# Patient Record
Sex: Female | Born: 1995 | Race: White | Hispanic: No | Marital: Single | Smoking: Never smoker
Health system: Southern US, Community
[De-identification: ages and names within clinical notes are randomized; demographics above are authoritative.]

## PROBLEM LIST (undated history)

## (undated) DIAGNOSIS — M7989 Other specified soft tissue disorders: Secondary | ICD-10-CM

## (undated) HISTORY — DX: Other specified soft tissue disorders: M79.89

---
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
## 2018-01-21 ENCOUNTER — Emergency Department: Payer: Medicaid Other

## 2018-01-21 ENCOUNTER — Encounter: Payer: Self-pay | Admitting: Internal Medicine

## 2018-01-21 ENCOUNTER — Inpatient Hospital Stay
Admission: EM | Admit: 2018-01-21 | Discharge: 2018-01-25 | DRG: 584 | Disposition: A | Discharge status: Home / Self Care | Payer: Medicaid Other | Source: Ambulatory Visit | Attending: Internal Medicine | Admitting: Internal Medicine

## 2018-01-21 ENCOUNTER — Other Ambulatory Visit: Payer: Self-pay

## 2018-01-21 DIAGNOSIS — M7989 Other specified soft tissue disorders: Secondary | ICD-10-CM

## 2018-01-21 DIAGNOSIS — N611 Abscess of the breast and nipple: Secondary | ICD-10-CM | POA: Diagnosis present

## 2018-01-21 DIAGNOSIS — I96 Gangrene, not elsewhere classified: Secondary | ICD-10-CM | POA: Diagnosis present

## 2018-01-21 DIAGNOSIS — E876 Hypokalemia: Secondary | ICD-10-CM | POA: Diagnosis present

## 2018-01-21 DIAGNOSIS — R8271 Bacteriuria: Secondary | ICD-10-CM | POA: Diagnosis present

## 2018-01-21 DIAGNOSIS — R740 Nonspecific elevation of levels of transaminase and lactic acid dehydrogenase [LDH]: Secondary | ICD-10-CM | POA: Diagnosis present

## 2018-01-21 DIAGNOSIS — M726 Necrotizing fasciitis: Secondary | ICD-10-CM

## 2018-01-21 LAB — COMPREHENSIVE METABOLIC PANEL
ALK PHOS: 56 U/L (ref 38–126)
ALT: 18 U/L (ref 0–44)
ANION GAP: 10 (ref 5–15)
AST: 23 U/L (ref 15–41)
Albumin: 3.9 g/dL (ref 3.5–5.0)
BILIRUBIN TOTAL: 0.5 mg/dL (ref 0.3–1.2)
BUN: 10 mg/dL (ref 6–20)
CALCIUM: 9.1 mg/dL (ref 8.9–10.3)
CO2: 27 mmol/L (ref 22–32)
Chloride: 105 mmol/L (ref 98–111)
Creatinine, Ser: 0.8 mg/dL (ref 0.44–1.00)
Glucose, Bld: 121 mg/dL — ABNORMAL HIGH (ref 70–99)
POTASSIUM: 3.1 mmol/L — AB (ref 3.5–5.1)
Sodium: 142 mmol/L (ref 135–145)
Total Protein: 7.9 g/dL (ref 6.5–8.1)

## 2018-01-21 LAB — CBC WITH DIFFERENTIAL/PLATELET
BASOS PCT: 1 %
Basophils Absolute: 0.1 10*3/uL (ref 0–0.1)
Eosinophils Absolute: 0 10*3/uL (ref 0–0.7)
Eosinophils Relative: 0 %
HEMATOCRIT: 38.4 % (ref 35.0–47.0)
Hemoglobin: 13 g/dL (ref 12.0–16.0)
LYMPHS PCT: 11 %
Lymphs Abs: 1.5 10*3/uL (ref 1.0–3.6)
MCH: 27.4 pg (ref 26.0–34.0)
MCHC: 33.8 g/dL (ref 32.0–36.0)
MCV: 81.2 fL (ref 80.0–100.0)
MONO ABS: 1.5 10*3/uL — AB (ref 0.2–0.9)
Monocytes Relative: 11 %
NEUTROS ABS: 10.8 10*3/uL — AB (ref 1.4–6.5)
Neutrophils Relative %: 77 %
PLATELETS: 407 10*3/uL (ref 150–440)
RBC: 4.73 MIL/uL (ref 3.80–5.20)
RDW: 14.2 % (ref 11.5–14.5)
WBC: 13.9 10*3/uL — AB (ref 3.6–11.0)

## 2018-01-21 LAB — URINALYSIS, COMPLETE (UACMP) WITH MICROSCOPIC
BILIRUBIN URINE: NEGATIVE
Glucose, UA: NEGATIVE mg/dL
KETONES UR: NEGATIVE mg/dL
NITRITE: POSITIVE — AB
PH: 5 (ref 5.0–8.0)
Protein, ur: 30 mg/dL — AB
Specific Gravity, Urine: 1.025 (ref 1.005–1.030)

## 2018-01-21 LAB — LACTIC ACID, PLASMA
LACTIC ACID, VENOUS: 1.1 mmol/L (ref 0.5–1.9)
Lactic Acid, Venous: 2.6 mmol/L (ref 0.5–1.9)

## 2018-01-21 LAB — PREGNANCY, URINE: Preg Test, Ur: NEGATIVE

## 2018-01-21 LAB — HEMOGLOBIN A1C
Hgb A1c MFr Bld: 5.5 % (ref 4.8–5.6)
Mean Plasma Glucose: 111.15 mg/dL

## 2018-01-21 MED ORDER — SODIUM CHLORIDE 0.9 % IV SOLN
1.0000 g | Freq: Once | INTRAVENOUS | Status: AC
  Administered 2018-01-21: 1 g via INTRAVENOUS
  Filled 2018-01-21: qty 1

## 2018-01-21 MED ORDER — ONDANSETRON HCL 4 MG PO TABS: 4.0000 mg | ORAL_TABLET | Freq: Four times a day (QID) | ORAL | Status: DC | PRN

## 2018-01-21 MED ORDER — ENOXAPARIN SODIUM 40 MG/0.4ML ~~LOC~~ SOLN: 40.0000 mg | SUBCUTANEOUS | Status: DC

## 2018-01-21 MED ORDER — ACETAMINOPHEN 325 MG PO TABS: 650.0000 mg | ORAL_TABLET | Freq: Four times a day (QID) | ORAL | Status: DC | PRN

## 2018-01-21 MED ORDER — ALBUTEROL SULFATE (2.5 MG/3ML) 0.083% IN NEBU: 2.5000 mg | INHALATION_SOLUTION | RESPIRATORY_TRACT | Status: DC | PRN

## 2018-01-21 MED ORDER — SODIUM CHLORIDE 0.9 % IV BOLUS
1000.0000 mL | Freq: Once | INTRAVENOUS | Status: AC
  Administered 2018-01-21: 1000 mL via INTRAVENOUS

## 2018-01-21 MED ORDER — VANCOMYCIN HCL IN DEXTROSE 1-5 GM/200ML-% IV SOLN
1000.0000 mg | Freq: Once | INTRAVENOUS | Status: AC
  Administered 2018-01-21: 1000 mg via INTRAVENOUS
  Filled 2018-01-21: qty 200

## 2018-01-21 MED ORDER — POTASSIUM CHLORIDE CRYS ER 20 MEQ PO TBCR
40.0000 meq | EXTENDED_RELEASE_TABLET | Freq: Once | ORAL | Status: AC
  Administered 2018-01-21: 40 meq via ORAL
  Filled 2018-01-21: qty 2

## 2018-01-21 MED ORDER — LIDOCAINE HCL (PF) 1 % IJ SOLN
INTRAMUSCULAR | Status: AC
  Filled 2018-01-21: qty 5

## 2018-01-21 MED ORDER — POLYETHYLENE GLYCOL 3350 17 G PO PACK: 17.0000 g | PACK | Freq: Every day | ORAL | Status: DC | PRN

## 2018-01-21 MED ORDER — ACETAMINOPHEN 650 MG RE SUPP: 650.0000 mg | Freq: Four times a day (QID) | RECTAL | Status: DC | PRN

## 2018-01-21 MED ORDER — ONDANSETRON HCL 4 MG/2ML IJ SOLN: 4.0000 mg | Freq: Four times a day (QID) | INTRAMUSCULAR | Status: DC | PRN

## 2018-01-21 MED ORDER — IBUPROFEN 400 MG PO TABS: 400.0000 mg | ORAL_TABLET | Freq: Four times a day (QID) | ORAL | Status: DC | PRN

## 2018-01-21 NOTE — ED Notes (Signed)
Ellen RN, aware of bed assigned  

## 2018-01-21 NOTE — ED Provider Notes (Signed)
[facility] Emergency Department Provider Note   ____________________________________________   None    (approximate)  I have reviewed the triage vital signs and the nursing notes.   HISTORY  Chief Complaint Breast Pain  Necrotizing nipple infection  HPI Garyn Waguespack is a 22 y.o. female she reports breast infection with draining pus and abscess started 3 days ago pain was severe she had a fever today at burst she went [facility] see the [facility] and was sent here.  Patient has about a silver dollar sized ulcer with necrosis at the base that smells foul covering most of the nipple and the upper part of the breast there is at least 4 cm surrounding erythema and firmness in the rest of the breast the nipple ring the patient has is partially buried in the infection patient reports it does not hurt anymore   No past medical history on file.  There are no active problems to display for this patient.   No past surgical history on file.  Prior to Admission medications   Not on File    Allergies Patient has no allergy information on record.  No family history on file.  Social History Social History   Tobacco Use  . Smoking status: Never Smoker  Substance Use Topics  . Alcohol use: No  . Drug use: No    Review of Systems  Constitutional:  fever/chills Eyes: No visual changes. ENT: No sore throat. Cardiovascular: Denies chest pain. Respiratory: Denies shortness of breath. Gastrointestinal: No abdominal pain.  No nausea, no vomiting.  No diarrhea.  No constipation. Genitourinary: Negative for dysuria. Musculoskeletal: Negative for back pain. Skin: Negative for rash. Neurological: Negative for headaches, focal weakness ____________________________________________   PHYSICAL EXAM:  VITAL SIGNS: ED Triage Vitals [01/21/18 1956]  Enc Vitals Group     BP      Pulse      Resp      Temp      Temp src      SpO2      Weight      Height    Head Circumference      Peak Flow      Pain Score 1     Pain Loc      Pain Edu?      Excl. in GC?     Constitutional: Alert and oriented Eyes: Conjunctivae are normal.  Head: Atraumatic. Nose: No congestion/rhinnorhea. Mouth/Throat: Mucous membranes are moist.  Oropharynx non-erythematous. Neck: No stridor.   Cardiovascular: Normal rate, regular rhythm. Grossly normal heart sounds.  Good peripheral circulation. Respiratory: Normal respiratory effort.  No retractions. Lungs CTAB. Gastrointestinal: Soft and nontender. No distention. No abdominal bruits. No CVA tenderness. Musculoskeletal: No lower extremity tenderness nor edema.  Neurologic:  Normal speech and language. No gross focal neurologic deficits are appreciated.  Skin:  Skin is warm, dry and intact. No rash noted. Psychiatric: Mood and affect are normal. Speech and behavior are normal.  ____________________________________________   LABS (all labs ordered are listed, but only abnormal results are displayed)  Labs Reviewed  COMPREHENSIVE METABOLIC PANEL  CBC WITH DIFFERENTIAL/PLATELET  LACTIC ACID, PLASMA  LACTIC ACID, PLASMA  URINALYSIS, COMPLETE (UACMP) WITH MICROSCOPIC  PREGNANCY, URINE   ____________________________________________  EKG EKG read and interpreted by me shows normal sinus rhythm at rate of 80 normal axis no acute ST-T wave changes ________________________________________  RADIOLOGY  ED MD interpretation: Chest x-ray shows no acute disease unfortunately the breasts were cut off of the  lateral.  X-ray is unable to do any sort of mammogram without compression therefore we will attempt to get another lateral to see if there is any air in the soft tissue of the right breast.  Official radiology report(s): No results found.  ____________________________________________   PROCEDURES  Procedure(s) performed: Oral consent was obtained.  The wound was cleaned with very dilute Betadine at least 1-10  dilution.  The area where the nipple ring is was anesthetized with lidocaine and I attempted to grasp the nipple ring and unscrew it.  Unfortunate was unable to do this however in the course of doing so I noted that the hole in the skin was actually fairly wide and I was able to gently ease the ring out of the skin without unscrewing it.  Patient tolerated this fairly well.  Procedures  Critical Care performed: Nickel care time half an hour this includes the above procedure and discussing the patient with the hospitalist reviewing the records from the urgent care and ordering blood work and antibiotics  ____________________________________________   INITIAL IMPRESSION / ASSESSMENT AND PLAN / ED COURSE  Patient with an apparent necrotic ulcer with surrounding cellulitis of the breast.  Treat her aggressively with antibiotics.  When I probed the wound after anesthetizing it did not feel any thing that would easily give way or tear.  I believe that with very close monitoring antibiotics we should be good.  I discussed the patient with hospitalist and the surgeon Dr. Elenor Legato.      ____________________________________________   FINAL CLINICAL IMPRESSION(S) / ED DIAGNOSES  Final diagnoses:  Necrotizing soft tissue infection     ED Discharge Orders    None       Note:  This document was prepared using Dragon voice recognition software and may include unintentional dictation errors.    Arnaldo Natal, MD 01/21/18 2109

## 2018-01-21 NOTE — ED Notes (Signed)
Dr  Elpidio Anis notified  Of lactic acid

## 2018-01-21 NOTE — ED Triage Notes (Signed)
Pt  Has  Breast infection r  Breast area   With  Red draining pus  Area   X 3 days  Had fever a  Few days ago  Getting  Worse and it burst today    Eschar present  Pt has a nipple ring in place   Pt was seen at [facility] and transferred here  Pt was given clindamycin  im  At [facility] and ems started ns iv  Has had about 200 cc

## 2018-01-21 NOTE — H&P (Signed)
[facility] [place] [facility]   PATIENT NAME: Tracy Stewart    MR#:  [id number]  DATE OF BIRTH:  [birth year]  DATE OF ADMISSION:  01/21/2018  PRIMARY CARE PHYSICIAN: Patient, No Pcp Per   REQUESTING/REFERRING PHYSICIAN: Dr. Darnelle Catalan  CHIEF COMPLAINT:   Chief Complaint  Patient presents with  . Breast Pain    HISTORY OF PRESENT ILLNESS:  Tracy Stewart  is a 22 y.o. female with no significant past medical history presented to the emergency room sent in from urgent care after being found to have breast abscess.  Patient has had redness and swelling along with fever for 3 days.  Today it spontaneously started draining.  She did have nipple rings which were removed in the emergency room.  She has had these for 5 years.  Is not breast-feeding.  Does not use any IV drugs.  No recent breast procedures. Nondiabetic.  PAST MEDICAL HISTORY:  History reviewed. No pertinent past medical history. No diabetes or hypertension PAST SURGICAL HISTORY:  No past surgical history on file.  SOCIAL HISTORY:   Social History   Tobacco Use  . Smoking status: Never Smoker  Substance Use Topics  . Alcohol use: No    FAMILY HISTORY:   Family History  Problem Relation Age of Onset  . Breast cancer Neg Hx     DRUG ALLERGIES:  No Known Allergies  REVIEW OF SYSTEMS:   Review of Systems  Constitutional: Positive for chills, fever and malaise/fatigue. Negative for weight loss.  HENT: Negative for hearing loss and nosebleeds.   Eyes: Negative for blurred vision, double vision and pain.  Respiratory: Negative for cough, hemoptysis, sputum production, shortness of breath and wheezing.   Cardiovascular: Negative for chest pain, palpitations, orthopnea and leg swelling.  Gastrointestinal: Negative for abdominal pain, constipation, diarrhea, nausea and vomiting.  Genitourinary: Negative for dysuria and hematuria.  Musculoskeletal: Negative for back pain, falls and  myalgias.  Skin: Negative for rash.  Neurological: Negative for dizziness, tremors, sensory change, speech change, focal weakness, seizures and headaches.  Endo/Heme/Allergies: Does not bruise/bleed easily.  Psychiatric/Behavioral: Negative for depression and memory loss. The patient is not nervous/anxious.     MEDICATIONS AT HOME:   Prior to Admission medications   Not on File     VITAL SIGNS:  Blood pressure 138/86, pulse 97, temperature 98.3 F (36.8 C), temperature source Oral, height 5\' 7"  (1.702 m), weight 68.5 kg, last menstrual period 01/07/2018, SpO2 100 %.  PHYSICAL EXAMINATION:  Physical Exam  GENERAL:  22 y.o.-year-old patient lying in the bed with no acute distress.  EYES: Pupils equal, round, reactive to light and accommodation. No scleral icterus. Extraocular muscles intact.  HEENT: Head atraumatic, normocephalic. Oropharynx and nasopharynx clear. No oropharyngeal erythema, moist oral mucosa  NECK:  Supple, no jugular venous distention. No thyroid enlargement, no tenderness.  LUNGS: Normal breath sounds bilaterally, no wheezing, rales, rhonchi. No use of accessory muscles of respiration.  CARDIOVASCULAR: S1, S2 normal. No murmurs, rubs, or gallops.  ABDOMEN: Soft, nontender, nondistended. Bowel sounds present. No organomegaly or mass.  EXTREMITIES: No pedal edema, cyanosis, or clubbing. + 2 pedal & radial pulses b/l.   NEUROLOGIC: Cranial nerves II through XII are intact. No focal Motor or sensory deficits appreciated b/l PSYCHIATRIC: The patient is alert and oriented x 3. Good affect.  SKIN: Right breast with redness covering half her breast.  Nipple and areole with fungating ulcer.  Purulent discharge.  LABORATORY PANEL:   CBC No results  for input(s): WBC, HGB, HCT, PLT in the last 168 hours. ------------------------------------------------------------------------------------------------------------------  Chemistries  No results for input(s): NA, K, CL, CO2,  GLUCOSE, BUN, CREATININE, CALCIUM, MG, AST, ALT, ALKPHOS, BILITOT in the last 168 hours.  Invalid input(s): GFRCGP ------------------------------------------------------------------------------------------------------------------  Cardiac Enzymes No results for input(s): TROPONINI in the last 168 hours. ------------------------------------------------------------------------------------------------------------------  RADIOLOGY:  No results found.   IMPRESSION AND PLAN:   * Right breat abscess. Spontaneously draining Will start IV abx Consult surgery Pain meds PRN Nipple ring removed  * Pyuria without symptoms Not uti Patient is on broad-spectrum antibiotics for breast abscess   *Elevated lactic acid due to breast abscess.  We will bolus 1 L normal saline stat.  *Hypokalemia.  Will replace orally.  All the records are reviewed and case discussed with ED provider. Management plans discussed with the patient, family and they are in agreement.  CODE STATUS: Full code  TOTAL TIME TAKING CARE OF THIS PATIENT: 40 minutes.   Molinda Bailiff Terrelle Ruffolo M.D on 01/21/2018 at 8:58 PM  Between 7am to 6pm - Pager - [phone]  After 6pm go to [email or web address] - password [email or web address]  [facility] [place] [facility]  Office  [phone]  CC: Primary care physician; Patient, No Pcp Per  Note: This dictation was prepared with Dragon dictation along with smaller phrase technology. Any transcriptional errors that result from this process are unintentional.

## 2018-01-21 NOTE — Consult Note (Signed)
Patient ID: Tracy Stewart, female   DOB: 03/07/1996, 22 y.o.   MRN: [id number]  HPI Tracy Stewart is a 22 y.o. female seen in consultation at the request of Dr. Malinda.  Case discussed with him in detail. Came in with 3 days of right breast pain, drainage of purulence .  He reports that the pain has been worsening for the last couple of days and today she had a spontaneous drainage of pus.  No fevers no chills.  Patient denies any family history of breast cancer.  She does have multiple piercings including a piercing on the right breast.  She is able to perform more than 4 METS of activity without any shortness of breath or chest pain.  The chest x-ray was personally reviewed showing no acute pulmonary disease.  She was 13.9 and a CMP was normal.  HPI  History reviewed. No pertinent past medical history.  No pertinent surgical or family hx.  Family History  Problem Relation Age of Onset  . Breast cancer Neg Hx     Social History Social History   Tobacco Use  . Smoking status: Never Smoker  Substance Use Topics  . Alcohol use: No  . Drug use: No    No Known Allergies  Current Facility-Administered Medications  Medication Dose Route Frequency Provider Last Rate Last Dose  . acetaminophen (TYLENOL) tablet 650 mg  650 mg Oral Q6H PRN Sudini, Srikar, MD       Or  . acetaminophen (TYLENOL) suppository 650 mg  650 mg Rectal Q6H PRN Sudini, Srikar, MD      . albuterol (PROVENTIL) (2.5 MG/3ML) 0.083% nebulizer solution 2.5 mg  2.5 mg Nebulization Q2H PRN Sudini, Srikar, MD      . enoxaparin (LOVENOX) injection 40 mg  40 mg Subcutaneous Q24H Sudini, Srikar, MD      . ibuprofen (ADVIL,MOTRIN) tablet 400 mg  400 mg Oral Q6H PRN Sudini, Srikar, MD      . lidocaine (PF) (XYLOCAINE) 1 % injection           . ondansetron (ZOFRAN) tablet 4 mg  4 mg Oral Q6H PRN Sudini, Srikar, MD       Or  . ondansetron (ZOFRAN) injection 4 mg  4 mg Intravenous Q6H PRN Sudini, Srikar, MD      .  polyethylene glycol (MIRALAX / GLYCOLAX) packet 17 g  17 g Oral Daily PRN Sudini, Srikar, MD       No current outpatient medications on file.     Review of Systems Full ROS  was asked and was negative except for the information on the HPI  Physical Exam Blood pressure 114/76, pulse (!) 104, temperature 98.3 F (36.8 C), temperature source Oral, resp. rate (!) 28, height 5' 7" (1.702 m), weight 68.5 kg, last menstrual period 01/07/2018, SpO2 100 %. CONSTITUTIONAL: NAD EYES: Pupils are equal, round, and reactive to light, Sclera are non-icteric. EARS, NOSE, MOUTH AND THROAT: The oropharynx is clear. The oral mucosa is pink and moist. Hearing is intact to voice. LYMPH NODES:  Lymph nodes in the neck are normal. RESPIRATORY:  Lungs are clear. There is normal respiratory effort, with equal breath sounds bilaterally, and without pathologic use of accessory muscles. CARDIOVASCULAR: Heart is regular without murmurs, gallops, or rubs. BREAST: Right breast cellulitis, twisted tenderness and necrotic nipple.  There is some spontaneous drainage of pus.  There is significant induration. GI: The abdomen is  soft, nontender, and nondistended. There are no palpable masses. There is no   hepatosplenomegaly. There are normal bowel sounds in all quadrants. GU: Rectal deferred.   MUSCULOSKELETAL: Normal muscle strength and tone. No cyanosis or edema.   SKIN: Turgor is good and there are no pathologic skin lesions or ulcers. NEUROLOGIC: Motor and sensation is grossly normal. Cranial nerves are grossly intact. PSYCH:  Oriented to person, place and time. Affect is normal.  Data Reviewed  I have personally reviewed the patient's imaging, laboratory findings and medical records.    Assessment/Plan 22 yo female with right breast cellulitis abscess and necrotic wound in need for wide debridement.  Discussed with the patient detail about her disease process.  We will start broad-spectrum antibiotics, IV fluids and  we will perform the surgery in the morning.  Discussed with the patient detail about the procedure.  Risk benefit and possible complications including but not limited to: Bleeding, infection, deformity.  I discussed specifically about cosmetic issues given the fact that most of her right nipple is already necrosis and without a doubt she will have significant scarring and deformity to the right breast. He understands and wishes to proceed.  Dr. Davis will be performing the surgery tomorrow  Tracy Palmateer, MD FACS General Surgeon 01/21/2018, 10:42 PM   

## 2018-01-21 NOTE — H&P (View-Only) (Signed)
Patient ID: Tracy Stewart, female   DOB: 07/18/1995, 22 y.o.   MRN: [id number]  HPI Tracy Stewart is a 22 y.o. female seen in consultation at the request of Dr. Darnelle Catalan.  Case discussed with him in detail. Came in with 3 days of right breast pain, drainage of purulence .  He reports that the pain has been worsening for the last couple of days and today she had a spontaneous drainage of pus.  No fevers no chills.  Patient denies any family history of breast cancer.  She does have multiple piercings including a piercing on the right breast.  She is able to perform more than 4 METS of activity without any shortness of breath or chest pain.  The chest x-ray was personally reviewed showing no acute pulmonary disease.  She was 13.9 and a CMP was normal.  HPI  History reviewed. No pertinent past medical history.  No pertinent surgical or family hx.  Family History  Problem Relation Age of Onset  . Breast cancer Neg Hx     Social History Social History   Tobacco Use  . Smoking status: Never Smoker  Substance Use Topics  . Alcohol use: No  . Drug use: No    No Known Allergies  Current Facility-Administered Medications  Medication Dose Route Frequency Provider Last Rate Last Dose  . acetaminophen (TYLENOL) tablet 650 mg  650 mg Oral Q6H PRN Milagros Loll, MD       Or  . acetaminophen (TYLENOL) suppository 650 mg  650 mg Rectal Q6H PRN Sudini, Srikar, MD      . albuterol (PROVENTIL) (2.5 MG/3ML) 0.083% nebulizer solution 2.5 mg  2.5 mg Nebulization Q2H PRN Sudini, Srikar, MD      . enoxaparin (LOVENOX) injection 40 mg  40 mg Subcutaneous Q24H Sudini, Srikar, MD      . ibuprofen (ADVIL,MOTRIN) tablet 400 mg  400 mg Oral Q6H PRN Sudini, Srikar, MD      . lidocaine (PF) (XYLOCAINE) 1 % injection           . ondansetron (ZOFRAN) tablet 4 mg  4 mg Oral Q6H PRN Sudini, Wardell Heath, MD       Or  . ondansetron (ZOFRAN) injection 4 mg  4 mg Intravenous Q6H PRN Sudini, Srikar, MD      .  polyethylene glycol (MIRALAX / GLYCOLAX) packet 17 g  17 g Oral Daily PRN Milagros Loll, MD       No current outpatient medications on file.     Review of Systems Full ROS  was asked and was negative except for the information on the HPI  Physical Exam Blood pressure 114/76, pulse (!) 104, temperature 98.3 F (36.8 C), temperature source Oral, resp. rate (!) 28, height 5\' 7"  (1.702 m), weight 68.5 kg, last menstrual period 01/07/2018, SpO2 100 %. CONSTITUTIONAL: NAD EYES: Pupils are equal, round, and reactive to light, Sclera are non-icteric. EARS, NOSE, MOUTH AND THROAT: The oropharynx is clear. The oral mucosa is pink and moist. Hearing is intact to voice. LYMPH NODES:  Lymph nodes in the neck are normal. RESPIRATORY:  Lungs are clear. There is normal respiratory effort, with equal breath sounds bilaterally, and without pathologic use of accessory muscles. CARDIOVASCULAR: Heart is regular without murmurs, gallops, or rubs. BREAST: Right breast cellulitis, twisted tenderness and necrotic nipple.  There is some spontaneous drainage of pus.  There is significant induration. GI: The abdomen is  soft, nontender, and nondistended. There are no palpable masses. There is no  hepatosplenomegaly. There are normal bowel sounds in all quadrants. GU: Rectal deferred.   MUSCULOSKELETAL: Normal muscle strength and tone. No cyanosis or edema.   SKIN: Turgor is good and there are no pathologic skin lesions or ulcers. NEUROLOGIC: Motor and sensation is grossly normal. Cranial nerves are grossly intact. PSYCH:  Oriented to person, place and time. Affect is normal.  Data Reviewed  I have personally reviewed the patient's imaging, laboratory findings and medical records.    Assessment/Plan 22 yo female with right breast cellulitis abscess and necrotic wound in need for wide debridement.  Discussed with the patient detail about her disease process.  We will start broad-spectrum antibiotics, IV fluids and  we will perform the surgery in the morning.  Discussed with the patient detail about the procedure.  Risk benefit and possible complications including but not limited to: Bleeding, infection, deformity.  I discussed specifically about cosmetic issues given the fact that most of her right nipple is already necrosis and without a doubt she will have significant scarring and deformity to the right breast. He understands and wishes to proceed.  Dr. Earlene Plater will be performing the surgery tomorrow  Sterling Big, MD FACS General Surgeon 01/21/2018, 10:42 PM

## 2018-01-21 NOTE — ED Notes (Signed)
Wound care and dressing by vanessa

## 2018-01-22 ENCOUNTER — Inpatient Hospital Stay: Payer: Medicaid Other | Admitting: Certified Registered"

## 2018-01-22 ENCOUNTER — Encounter
Admission: EM | Disposition: A | Discharge status: Home / Self Care | Payer: Self-pay | Source: Ambulatory Visit | Attending: Internal Medicine

## 2018-01-22 DIAGNOSIS — M7989 Other specified soft tissue disorders: Secondary | ICD-10-CM

## 2018-01-22 DIAGNOSIS — M726 Necrotizing fasciitis: Secondary | ICD-10-CM

## 2018-01-22 HISTORY — PX: INCISION AND DRAINAGE ABSCESS: SHX5864

## 2018-01-22 LAB — CBC
HEMATOCRIT: 36.4 % (ref 35.0–47.0)
HEMOGLOBIN: 12.4 g/dL (ref 12.0–16.0)
MCH: 27.7 pg (ref 26.0–34.0)
MCHC: 34.2 g/dL (ref 32.0–36.0)
MCV: 80.9 fL (ref 80.0–100.0)
Platelets: 361 10*3/uL (ref 150–440)
RBC: 4.49 MIL/uL (ref 3.80–5.20)
RDW: 13.9 % (ref 11.5–14.5)
WBC: 9.3 10*3/uL (ref 3.6–11.0)

## 2018-01-22 LAB — SURGICAL PCR SCREEN
MRSA, PCR: NEGATIVE
STAPHYLOCOCCUS AUREUS: NEGATIVE

## 2018-01-22 LAB — BASIC METABOLIC PANEL
ANION GAP: 7 (ref 5–15)
BUN: 9 mg/dL (ref 6–20)
CALCIUM: 8.5 mg/dL — AB (ref 8.9–10.3)
CO2: 25 mmol/L (ref 22–32)
Chloride: 109 mmol/L (ref 98–111)
Creatinine, Ser: 0.72 mg/dL (ref 0.44–1.00)
GLUCOSE: 95 mg/dL (ref 70–99)
POTASSIUM: 3.8 mmol/L (ref 3.5–5.1)
SODIUM: 141 mmol/L (ref 135–145)

## 2018-01-22 SURGERY — INCISION AND DRAINAGE, ABSCESS
Anesthesia: General | Laterality: Right

## 2018-01-22 MED ORDER — TRAMADOL HCL 50 MG PO TABS: 50.0000 mg | ORAL_TABLET | Freq: Four times a day (QID) | ORAL | Status: DC | PRN

## 2018-01-22 MED ORDER — ENOXAPARIN SODIUM 40 MG/0.4ML ~~LOC~~ SOLN
40.0000 mg | SUBCUTANEOUS | Status: DC
  Administered 2018-01-23 – 2018-01-24 (×2): 40 mg via SUBCUTANEOUS
  Filled 2018-01-22 (×3): qty 0.4

## 2018-01-22 MED ORDER — VANCOMYCIN HCL IN DEXTROSE 1-5 GM/200ML-% IV SOLN
INTRAVENOUS | Status: AC
  Administered 2018-01-22: 10:00:00
  Filled 2018-01-22: qty 200

## 2018-01-22 MED ORDER — ONDANSETRON HCL 4 MG/2ML IJ SOLN
INTRAMUSCULAR | Status: DC | PRN
  Administered 2018-01-22: 4 mg via INTRAVENOUS

## 2018-01-22 MED ORDER — FENTANYL CITRATE (PF) 100 MCG/2ML IJ SOLN
INTRAMUSCULAR | Status: AC
  Filled 2018-01-22: qty 2

## 2018-01-22 MED ORDER — BUPIVACAINE HCL (PF) 0.5 % IJ SOLN
INTRAMUSCULAR | Status: AC
  Filled 2018-01-22: qty 30

## 2018-01-22 MED ORDER — KCL IN DEXTROSE-NACL 20-5-0.45 MEQ/L-%-% IV SOLN
INTRAVENOUS | Status: DC
  Administered 2018-01-22 – 2018-01-25 (×6): via INTRAVENOUS
  Filled 2018-01-22 (×9): qty 1000

## 2018-01-22 MED ORDER — SODIUM CHLORIDE FLUSH 0.9 % IV SOLN
INTRAVENOUS | Status: AC
  Administered 2018-01-22: 16:00:00
  Filled 2018-01-22: qty 10

## 2018-01-22 MED ORDER — PROPOFOL 10 MG/ML IV BOLUS
INTRAVENOUS | Status: DC | PRN
  Administered 2018-01-22: 150 mg via INTRAVENOUS
  Administered 2018-01-22: 50 mg via INTRAVENOUS

## 2018-01-22 MED ORDER — FENTANYL CITRATE (PF) 100 MCG/2ML IJ SOLN
INTRAMUSCULAR | Status: DC | PRN
  Administered 2018-01-22 (×4): 50 ug via INTRAVENOUS

## 2018-01-22 MED ORDER — VANCOMYCIN HCL IN DEXTROSE 1-5 GM/200ML-% IV SOLN
1000.0000 mg | Freq: Three times a day (TID) | INTRAVENOUS | Status: DC
  Filled 2018-01-22 (×3): qty 200

## 2018-01-22 MED ORDER — BUPIVACAINE HCL (PF) 0.5 % IJ SOLN
INTRAMUSCULAR | Status: DC | PRN
  Administered 2018-01-22: 20 mL

## 2018-01-22 MED ORDER — VANCOMYCIN HCL IN DEXTROSE 1-5 GM/200ML-% IV SOLN
1000.0000 mg | Freq: Three times a day (TID) | INTRAVENOUS | Status: DC
  Administered 2018-01-22 – 2018-01-23 (×4): 1000 mg via INTRAVENOUS
  Filled 2018-01-22 (×6): qty 200

## 2018-01-22 MED ORDER — KETOROLAC TROMETHAMINE 30 MG/ML IJ SOLN
30.0000 mg | Freq: Four times a day (QID) | INTRAMUSCULAR | Status: DC
  Administered 2018-01-22 – 2018-01-23 (×6): 30 mg via INTRAVENOUS
  Filled 2018-01-22 (×9): qty 1

## 2018-01-22 MED ORDER — ACETAMINOPHEN 500 MG PO TABS
1000.0000 mg | ORAL_TABLET | Freq: Four times a day (QID) | ORAL | Status: DC
  Administered 2018-01-22 – 2018-01-24 (×7): 1000 mg via ORAL
  Filled 2018-01-22 (×9): qty 2

## 2018-01-22 MED ORDER — PROPOFOL 10 MG/ML IV BOLUS
INTRAVENOUS | Status: AC
  Filled 2018-01-22: qty 20

## 2018-01-22 MED ORDER — LIDOCAINE HCL (CARDIAC) PF 100 MG/5ML IV SOSY
PREFILLED_SYRINGE | INTRAVENOUS | Status: DC | PRN
  Administered 2018-01-22: 100 mg via INTRAVENOUS

## 2018-01-22 MED ORDER — VANCOMYCIN HCL 1000 MG IV SOLR
INTRAVENOUS | Status: DC | PRN
  Administered 2018-01-22: 1000 mg via INTRAVENOUS

## 2018-01-22 MED ORDER — MORPHINE SULFATE (PF) 2 MG/ML IV SOLN
2.0000 mg | Freq: Four times a day (QID) | INTRAVENOUS | Status: DC | PRN
  Administered 2018-01-24: 2 mg via INTRAVENOUS
  Filled 2018-01-22 (×2): qty 1

## 2018-01-22 MED ORDER — FENTANYL CITRATE (PF) 100 MCG/2ML IJ SOLN
INTRAMUSCULAR | Status: AC
  Administered 2018-01-22: 25 ug via INTRAVENOUS
  Filled 2018-01-22: qty 2

## 2018-01-22 MED ORDER — MIDAZOLAM HCL 2 MG/2ML IJ SOLN
INTRAMUSCULAR | Status: DC | PRN
  Administered 2018-01-22: 2 mg via INTRAVENOUS

## 2018-01-22 MED ORDER — MIDAZOLAM HCL 2 MG/2ML IJ SOLN
INTRAMUSCULAR | Status: AC
  Filled 2018-01-22: qty 2

## 2018-01-22 MED ORDER — ONDANSETRON HCL 4 MG/2ML IJ SOLN: 4.0000 mg | Freq: Once | INTRAMUSCULAR | Status: DC | PRN

## 2018-01-22 MED ORDER — FENTANYL CITRATE (PF) 100 MCG/2ML IJ SOLN
25.0000 ug | INTRAMUSCULAR | Status: DC | PRN
  Administered 2018-01-22 (×2): 25 ug via INTRAVENOUS

## 2018-01-22 MED ORDER — LACTATED RINGERS IV SOLN
INTRAVENOUS | Status: DC | PRN
  Administered 2018-01-22: 09:00:00 via INTRAVENOUS

## 2018-01-22 MED ORDER — LIDOCAINE HCL (PF) 2 % IJ SOLN
INTRAMUSCULAR | Status: AC
  Filled 2018-01-22: qty 10

## 2018-01-22 SURGICAL SUPPLY — 31 items
BLADE SURG 11 STRL SS SAFETY (MISCELLANEOUS) ×3 IMPLANT
BRIEF STRETCH MATERNITY 2XLG (MISCELLANEOUS) ×3 IMPLANT
DRAIN PENROSE 1/4X12 LTX (DRAIN) ×2 IMPLANT
DRAIN PENROSE 5/8X12 LTX STRL (DRAIN) ×3 IMPLANT
DRAPE LAPAROTOMY 100X77 ABD (DRAPES) ×3 IMPLANT
DRAPE UTILITY 15X26 TOWEL STRL (DRAPES) ×3 IMPLANT
ELECT CAUTERY BLADE 6.4 (BLADE) ×3 IMPLANT
ELECT REM PT RETURN 9FT ADLT (ELECTROSURGICAL) ×3
ELECTRODE REM PT RTRN 9FT ADLT (ELECTROSURGICAL) ×1 IMPLANT
GAUZE PACKING IODOFORM 1/2 (PACKING) ×3 IMPLANT
GAUZE SPONGE 4X4 12PLY STRL (GAUZE/BANDAGES/DRESSINGS) ×3 IMPLANT
GLOVE BIO SURGEON STRL SZ7 (GLOVE) ×3 IMPLANT
GLOVE BIOGEL PI IND STRL 7.5 (GLOVE) ×1 IMPLANT
GLOVE BIOGEL PI INDICATOR 7.5 (GLOVE) ×2
GOWN STRL REUS W/ TWL LRG LVL3 (GOWN DISPOSABLE) ×2 IMPLANT
GOWN STRL REUS W/TWL LRG LVL3 (GOWN DISPOSABLE) ×4
KIT TURNOVER KIT A (KITS) ×3 IMPLANT
NEEDLE HYPO 22GX1.5 SAFETY (NEEDLE) ×3 IMPLANT
NS IRRIG 500ML POUR BTL (IV SOLUTION) ×3 IMPLANT
PACK BASIN MINOR ARMC (MISCELLANEOUS) ×3 IMPLANT
PAD ABD DERMACEA PRESS 5X9 (GAUZE/BANDAGES/DRESSINGS) ×6 IMPLANT
PULSAVAC PLUS IRRIG FAN TIP (DISPOSABLE) ×3
SPONGE DRAIN TRACH 4X4 STRL 2S (GAUZE/BANDAGES/DRESSINGS) ×2 IMPLANT
SPONGE XRAY 4X4 16PLY STRL (MISCELLANEOUS) ×5 IMPLANT
SUT ETHILON 3-0 FS-10 30 BLK (SUTURE) ×3
SUTURE EHLN 3-0 FS-10 30 BLK (SUTURE) IMPLANT
SWAB DUAL CULTURE TRANS RED ST (MISCELLANEOUS) ×3 IMPLANT
SYR 10ML LL (SYRINGE) ×3 IMPLANT
SYR 20CC LL (SYRINGE) ×3 IMPLANT
SYR BULB 3OZ (MISCELLANEOUS) ×3 IMPLANT
TIP FAN IRRIG PULSAVAC PLUS (DISPOSABLE) IMPLANT

## 2018-01-22 NOTE — Consult Note (Signed)
Pharmacy Antibiotic Note  Tracy Stewart is a 22 y.o. female admitted on 01/21/2018 with Right breast necrotizing soft tissue infection.  Pharmacy has been consulted for vancomycin dosing.  Plan: Vancomycin 1000mg  given in the OR. Will give next dose in 6 hours for stacked dosing Vancomycin 1000mg  IV every 8 hours.  Goal trough 15-20 mcg/mL. Trough prior to 5th dose  Height: 5\' 7"  (170.2 cm) Weight: 151 lb (68.5 kg) IBW/kg (Calculated) : 61.6  Temp (24hrs), Avg:98.2 F (36.8 C), Min:97.5 F (36.4 C), Max:98.6 F (37 C)  Recent Labs  Lab 01/21/18 1954 01/21/18 2319 01/22/18 0532  WBC 13.9*  --  9.3  CREATININE 0.80  --  0.72  LATICACIDVEN 2.6* 1.1  --     Estimated Creatinine Clearance: 106.4 mL/min (by C-G formula based on SCr of 0.72 mg/dL).    No Known Allergies  Antimicrobials this admission: vancomycin 9/27 >>    Dose adjustments this admission:   Microbiology results:  9/26 MRSA PCR: neg  Thank you for allowing pharmacy to be a part of this patient's care.  Olene Floss, Pharm.D, BCPS Clinical Pharmacist 01/22/2018 12:17 PM

## 2018-01-22 NOTE — Op Note (Addendum)
SURGICAL OPERATIVE REPORT   DATE OF PROCEDURE: 01/22/2018  ATTENDING Surgeon(s): Ancil Linsey, MD  ANESTHESIA: GETA  PRE-OPERATIVE DIAGNOSIS: Right breast peri-areolar necrotizing soft tissue infection (icd-10's: M72.6)   POST-OPERATIVE DIAGNOSIS: Right breast peri-areolar necrotizing soft tissue infection; no abscess (icd-10's: M72.6)   PROCEDURE(S):  1.) Sharp excisional debridement of Right medial breast peri-areolar necrotic soft tissue (cpt's: 11043)   INTRAOPERATIVE FINDINGS: no abscess appreciated; 3 cm wide x 2 cm tall x 1 - 2 cm deep Right medial peri-areolar breast wound comprised of necrotic skin, necrotic subcutaneous fat, and necrotic breast tissue, including ~1 cm of circumferential undermining and an additional 3 cmm inferior undermining, for which nonviable skin was excised using Metzenbaum scissors and forceps, followed by pulse lavage and placement of 1/4" Penrose drain to ensure adequate packing and drainage of inferior undermining   INTRAVENOUS FLUIDS: 600 mL crystalloid    ESTIMATED BLOOD LOSS: Minimal (<20 mL)    URINE OUTPUT: No Foley catheter    SPECIMENS: None   IMPLANTS: None   DRAINS: 1/4" Penrose drain to ensure adequate packing and drainage of inferior undermining   COMPLICATIONS: None apparent   CONDITION AT END OF PROCEDURE: Hemodynamically stable and extubated   DISPOSITION OF PATIENT: PACU   INDICATIONS FOR PROCEDURE:  Patient is 22 year-old female who presented to [facility] ED for Right breast pain. Patient reported she first noticed Right breast pain and redness 3 - 4 days earlier, after which both worsened with fever/chills and white drainage. Patient reported she has had a Right nipple piercing x 5 years and denies any recent Right breast trauma. She also states she felt "too weak" to seek evaluation sooner. She otherwise reports her pain has improved, denied N/V, CP, or SOB. All risks, benefits,  and alternatives to incision, drainage, and debridement of Right shoulder wound were discussed with the patient, all of patient's questions were answered to her expressed satisfaction, and informed consent was obtained and documented.   DETAILS OF PROCEDURE: Patient was brought to the operating suite and appropriately identified. General anesthesia was administered and endotracheal intubation was performed by anesthetist. Operative site was prepped and draped in the usual sterile fashion, and following a brief time out, the wound was sharply debrided using primarily forceps and Metzenbaum scissors with scalpel as needed. Necrosis extended to and included skin, subcutaneous fat, and mammary breast tissue with at least 1 cm of circumferential undermining and 3 cm inferior undermining, for which non-viable tissue was excised likewise. Pulse lavage was performed with direct pressure and selective electrocautery for hemostasis. Surrounding skin was cleaned and dried, a 1/4" Penrose drain was placed and secured from wound bed through inferior undermining and connected via a small inferior stab wound. A sterile slightly moist gauze, dry gauze, ABD, and adhesive dressing was applied. Patient was then safely able to be extubated and transferred to PACU for post-operative monitoring and care.   I was present for all aspects of the above procedure, and no operative complications were apparent.

## 2018-01-22 NOTE — Progress Notes (Signed)
[facility] -  at [facility]   PATIENT NAME: Tracy Stewart    MR#:  [id number]  DATE OF BIRTH:  12-Nov-1995  SUBJECTIVE:  CHIEF COMPLAINT:  Patient was seen after I&D feeling much better pain is tolerable  REVIEW OF SYSTEMS:  CONSTITUTIONAL: No fever, fatigue or weakness.  EYES: No blurred or double vision.  EARS, NOSE, AND THROAT: No tinnitus or ear pain.  RESPIRATORY: No cough, shortness of breath, wheezing or hemoptysis.  CARDIOVASCULAR: No chest pain, orthopnea, edema.  GASTROINTESTINAL: No nausea, vomiting, diarrhea or abdominal pain.  GENITOURINARY: No dysuria, hematuria.  ENDOCRINE: No polyuria, nocturia,  HEMATOLOGY: No anemia, easy bruising or bleeding SKIN: No rash or lesion. MUSCULOSKELETAL: No joint pain or arthritis.   NEUROLOGIC: No tingling, numbness, weakness.  PSYCHIATRY: No anxiety or depression.   DRUG ALLERGIES:  No Known Allergies  VITALS:  Blood pressure 114/68, pulse 81, temperature 98 F (36.7 C), temperature source Oral, resp. rate 16, height 5\' 7"  (1.702 m), weight 68.5 kg, last menstrual period 01/07/2018, SpO2 99 %.  PHYSICAL EXAMINATION:  GENERAL:  22 y.o.-year-old patient lying in the bed with no acute distress.  EYES: Pupils equal, round, reactive to light and accommodation. No scleral icterus. Extraocular muscles intact.  HEENT: Head atraumatic, normocephalic. Oropharynx and nasopharynx clear.  NECK:  Supple, no jugular venous distention. No thyroid enlargement, no tenderness.  LUNGS: Normal breath sounds bilaterally, no wheezing, rales,rhonchi or crepitation. No use of accessory muscles of respiration.  Breast -right breast and clean dressing tender, left breast is intact CARDIOVASCULAR: S1, S2 normal. No murmurs, rubs, or gallops.  ABDOMEN: Soft, nontender, nondistended. Bowel sounds present.   EXTREMITIES: No pedal edema, cyanosis, or clubbing.  NEUROLOGIC: Cranial nerves II through XII are intact. Muscle  strength 5/5 in all extremities. Sensation intact. Gait not checked.  PSYCHIATRIC: The patient is alert and oriented x 3.  SKIN: No obvious rash, lesion, or ulcer.    LABORATORY PANEL:   CBC Recent Labs  Lab 01/22/18 0532  WBC 9.3  HGB 12.4  HCT 36.4  PLT 361   ------------------------------------------------------------------------------------------------------------------  Chemistries  Recent Labs  Lab 01/21/18 1954 01/22/18 0532  NA 142 141  K 3.1* 3.8  CL 105 109  CO2 27 25  GLUCOSE 121* 95  BUN 10 9  CREATININE 0.80 0.72  CALCIUM 9.1 8.5*  AST 23  --   ALT 18  --   ALKPHOS 56  --   BILITOT 0.5  --    ------------------------------------------------------------------------------------------------------------------  Cardiac Enzymes No results for input(s): TROPONINI in the last 168 hours. ------------------------------------------------------------------------------------------------------------------  RADIOLOGY:  Dg Chest 2 View  Result Date: 01/21/2018 CLINICAL DATA:  Recent right breast infection getting worse. EXAM: CHEST - 2 VIEW COMPARISON:  None. FINDINGS: Lungs are adequately inflated without consolidation or effusion. Cardiomediastinal silhouette is within normal. Bones and soft tissues are normal. IMPRESSION: No active cardiopulmonary disease. Electronically Signed   By: Elberta Fortis M.D.   On: 01/21/2018 21:25    EKG:   Orders placed or performed during the hospital encounter of 01/21/18  . EKG 12-Lead  . EKG 12-Lead  . EKG    ASSESSMENT AND PLAN:    Right breat abscess with periareolar necrotic tissue.  Status post IND  Pain management as needed  Continue IV antibiotics and follow-up with culture if sent by surgery Follow-up with surgery Nipple ring removed  * Pyuria without symptoms Not uti Patient is on broad-spectrum antibiotics for breast abscess   *Elevated  lactic acid due to breast abscess.    Status post fluids continue  close monitoring of the lactic acid level  *Hypokalemia.  Repleted potassium is at 3.8     All the records are reviewed and case discussed with Care Management/Social Workerr. Management plans discussed with the patient, family and they are in agreement.  CODE STATUS: fc  TOTAL TIME TAKING CARE OF THIS PATIENT: 34 minutes.   POSSIBLE D/C IN 2 DAYS, DEPENDING ON CLINICAL CONDITION.  Note: This dictation was prepared with Dragon dictation along with smaller phrase technology. Any transcriptional errors that result from this process are unintentional.   Ramonita Lab M.D on 01/22/2018 at 4:01 PM  Between 7am to 6pm - Pager - [phone] After 6pm go to [email or web address] - password [email or web address]  [facility]  Office  [phone]  CC: Primary care physician; Patient, No Pcp Per

## 2018-01-22 NOTE — Transfer of Care (Signed)
Immediate Anesthesia Transfer of Care Note  Patient: Tracy Stewart  Procedure(s) Performed: INCISION AND DRAINAGE BREAST ABSCESS (Right )  Patient Location: PACU  Anesthesia Type:General  Level of Consciousness: awake and responds to stimulation  Airway & Oxygen Therapy: Patient Spontanous Breathing  Post-op Assessment: Report given to RN and Post -op Vital signs reviewed and stable  Post vital signs: Reviewed and stable  Last Vitals:  Vitals Value Taken Time  BP 115/92 01/22/2018 10:27 AM  Temp    Pulse 91 01/22/2018 10:27 AM  Resp 15 01/22/2018 10:27 AM  SpO2 100 % 01/22/2018 10:27 AM    Last Pain:  Vitals:   01/22/18 0843  TempSrc: Tympanic  PainSc: 0-No pain         Complications: No apparent anesthesia complications

## 2018-01-22 NOTE — Anesthesia Preprocedure Evaluation (Signed)
Anesthesia Evaluation  Patient identified by MRN, date of birth, ID band Patient awake    Reviewed: Allergy & Precautions, NPO status , Patient's Chart, lab work & pertinent test results  Airway Mallampati: II  TM Distance: >3 FB     Dental   Pulmonary neg pulmonary ROS,    Pulmonary exam normal        Cardiovascular negative cardio ROS Normal cardiovascular exam     Neuro/Psych negative neurological ROS  negative psych ROS   GI/Hepatic negative GI ROS, Neg liver ROS,   Endo/Other  negative endocrine ROS  Renal/GU negative Renal ROS  negative genitourinary   Musculoskeletal negative musculoskeletal ROS (+)   Abdominal Normal abdominal exam  (+)   Peds negative pediatric ROS (+)  Hematology negative hematology ROS (+)   Anesthesia Other Findings   Reproductive/Obstetrics                             Anesthesia Physical Anesthesia Plan  ASA: I  Anesthesia Plan: General   Post-op Pain Management:    Induction: Intravenous  PONV Risk Score and Plan:   Airway Management Planned: Oral ETT and LMA  Additional Equipment:   Intra-op Plan:   Post-operative Plan: Extubation in OR  Informed Consent: I have reviewed the patients History and Physical, chart, labs and discussed the procedure including the risks, benefits and alternatives for the proposed anesthesia with the patient or authorized representative who has indicated his/her understanding and acceptance.   Dental advisory given  Plan Discussed with: CRNA and Surgeon  Anesthesia Plan Comments:         Anesthesia Quick Evaluation

## 2018-01-22 NOTE — Anesthesia Procedure Notes (Signed)
Procedure Name: LMA Insertion Performed by: Casey Burkitt, CRNA Pre-anesthesia Checklist: Patient identified, Patient being monitored, Timeout performed, Emergency Drugs available and Suction available Patient Re-evaluated:Patient Re-evaluated prior to induction Oxygen Delivery Method: Circle system utilized Preoxygenation: Pre-oxygenation with 100% oxygen Induction Type: IV induction Ventilation: Mask ventilation without difficulty LMA: LMA inserted LMA Size: 4.0 Tube type: Oral Number of attempts: 2 Placement Confirmation: positive ETCO2 and breath sounds checked- equal and bilateral Tube secured with: Tape Dental Injury: Teeth and Oropharynx as per pre-operative assessment  Comments: First attempt with 3.5 LMA, unable to seat, removed and replaced with 4.0 LMA. +BBS, +ETCO2.

## 2018-01-22 NOTE — Interval H&P Note (Signed)
History and Physical Interval Note:  01/22/2018 8:58 AM  Tracy Stewart  has presented today for surgery, with the diagnosis of N/A  The various methods of treatment have been discussed with the patient and family. After consideration of risks, benefits and other options for treatment, the patient has consented to  Procedure(s): INCISION AND DRAINAGE BREAST ABSCESS (Right) as a surgical intervention .  The patient's history has been reviewed, patient examined, no change in status, stable for surgery.  I have reviewed the patient's chart and labs.  Questions were answered to the patient's satisfaction.     Ancil Linsey

## 2018-01-22 NOTE — Anesthesia Postprocedure Evaluation (Signed)
Anesthesia Post Note  Patient: Human resources officer  Procedure(s) Performed: INCISION AND DRAINAGE BREAST ABSCESS (Right )  Patient location during evaluation: PACU Anesthesia Type: General Level of consciousness: awake and alert and oriented Pain management: pain level controlled Vital Signs Assessment: post-procedure vital signs reviewed and stable Respiratory status: spontaneous breathing Cardiovascular status: blood pressure returned to baseline Anesthetic complications: no     Last Vitals:  Vitals:   01/22/18 1055 01/22/18 1105  BP: 119/77 117/74  Pulse:    Resp: 11 16  Temp:  37 C  SpO2: 100% 97%    Last Pain:  Vitals:   01/22/18 1055  TempSrc:   PainSc: 3                  Anaston Koehn

## 2018-01-22 NOTE — Anesthesia Post-op Follow-up Note (Signed)
Anesthesia QCDR form completed.        

## 2018-01-23 LAB — CBC
HEMATOCRIT: 36.6 % (ref 35.0–47.0)
HEMOGLOBIN: 12.5 g/dL (ref 12.0–16.0)
MCH: 27.7 pg (ref 26.0–34.0)
MCHC: 34.2 g/dL (ref 32.0–36.0)
MCV: 81.1 fL (ref 80.0–100.0)
Platelets: 327 10*3/uL (ref 150–440)
RBC: 4.51 MIL/uL (ref 3.80–5.20)
RDW: 14.2 % (ref 11.5–14.5)
WBC: 9.3 10*3/uL (ref 3.6–11.0)

## 2018-01-23 LAB — BASIC METABOLIC PANEL
ANION GAP: 5 (ref 5–15)
BUN: 10 mg/dL (ref 6–20)
CHLORIDE: 109 mmol/L (ref 98–111)
CO2: 25 mmol/L (ref 22–32)
Calcium: 8.4 mg/dL — ABNORMAL LOW (ref 8.9–10.3)
Creatinine, Ser: 0.64 mg/dL (ref 0.44–1.00)
GFR calc non Af Amer: >60 mL/min (ref >60)
GLUCOSE: 122 mg/dL — AB (ref 70–99)
Potassium: 3.5 mmol/L (ref 3.5–5.1)
Sodium: 139 mmol/L (ref 135–145)

## 2018-01-23 LAB — HIV ANTIBODY (ROUTINE TESTING W REFLEX): HIV SCREEN 4TH GENERATION: NONREACTIVE

## 2018-01-23 LAB — VANCOMYCIN, TROUGH: Vancomycin Tr: 11 ug/mL — ABNORMAL LOW (ref 15–20)

## 2018-01-23 MED ORDER — POTASSIUM CHLORIDE CRYS ER 20 MEQ PO TBCR
20.0000 meq | EXTENDED_RELEASE_TABLET | Freq: Two times a day (BID) | ORAL | Status: DC
  Administered 2018-01-23 – 2018-01-24 (×4): 20 meq via ORAL
  Filled 2018-01-23 (×5): qty 1

## 2018-01-23 MED ORDER — VANCOMYCIN HCL 10 G IV SOLR
1250.0000 mg | Freq: Three times a day (TID) | INTRAVENOUS | Status: DC
  Administered 2018-01-24 – 2018-01-25 (×5): 1250 mg via INTRAVENOUS
  Filled 2018-01-23 (×9): qty 1250

## 2018-01-23 NOTE — Progress Notes (Signed)
Subjective:  CC:  Tracy Stewart is a 22 y.o. female  Hospital stay day 2, 1 Day Post-Op right breast abscess debridement  HPI: No issues overnight.  Feeling better  ROS:  A 5 point review of systems was performed and pertinent positives and negatives noted in HPI.   Objective:      Temp:  [98 F (36.7 C)-98.6 F (37 C)] 98.6 F (37 C) (09/28 1812) Pulse Rate:  [58-64] 64 (09/28 1812) Resp:  [18] 18 (09/28 1812) BP: (104-122)/(59-69) 122/69 (09/28 1812) SpO2:  [98 %-99 %] 98 % (09/28 1812)     Height: 5\' 7"  (170.2 cm) Weight: 68.5 kg BMI (Calculated): 23.64   Intake/Output this shift:  No intake/output data recorded.       Constitutional :  alert, cooperative, appears stated age and no distress  Respiratory:  clear to auscultation bilaterally  Cardiovascular:  regular rate and rhythm  Gastrointestinal: soft, non-tender; bowel sounds normal; no masses,  no organomegaly.   Skin: Cool and moist. Right breast dressing intact, with not streakthrough, minimally tender to touch  Psychiatric: Normal affect, non-agitated, not confused       LABS:  CMP Latest Ref Rng & Units 01/23/2018 01/22/2018 01/21/2018  Glucose 70 - 99 mg/dL 161(W) 95 960(A)  BUN 6 - 20 mg/dL 10 9 10   Creatinine 0.44 - 1.00 mg/dL 5.40 9.81 1.91  Sodium 135 - 145 mmol/L 139 141 142  Potassium 3.5 - 5.1 mmol/L 3.5 3.8 3.1(L)  Chloride 98 - 111 mmol/L 109 109 105  CO2 22 - 32 mmol/L 25 25 27   Calcium 8.9 - 10.3 mg/dL 4.7(W) 2.9(F) 9.1  Total Protein 6.5 - 8.1 g/dL - - 7.9  Total Bilirubin 0.3 - 1.2 mg/dL - - 0.5  Alkaline Phos 38 - 126 U/L - - 56  AST 15 - 41 U/L - - 23  ALT 0 - 44 U/L - - 18   CBC Latest Ref Rng & Units 01/23/2018 01/22/2018 01/21/2018  WBC 3.6 - 11.0 K/uL 9.3 9.3 13.9(H)  Hemoglobin 12.0 - 16.0 g/dL 62.1 30.8 65.7  Hematocrit 35.0 - 47.0 % 36.6 36.4 38.4  Platelets 150 - 440 K/uL 327 361 407    RADS: n/a Assessment:   S/p debridement of right breast tissue.  Doing well.  Will  do wound check and dressing change tomorrow.

## 2018-01-23 NOTE — Progress Notes (Signed)
[facility] - [place] at [facility]   PATIENT NAME: Tracy Stewart    MR#:  [id number]  DATE OF BIRTH:  16-Feb-1996  SUBJECTIVE:  CHIEF COMPLAINT:  s/p I&D feeling much better pain is tolerable  REVIEW OF SYSTEMS:  CONSTITUTIONAL: No fever, fatigue or weakness.  EYES: No blurred or double vision.  EARS, NOSE, AND THROAT: No tinnitus or ear pain.  RESPIRATORY: No cough, shortness of breath, wheezing or hemoptysis.  CARDIOVASCULAR: No chest pain, orthopnea, edema.  GASTROINTESTINAL: No nausea, vomiting, diarrhea or abdominal pain.  GENITOURINARY: No dysuria, hematuria.  ENDOCRINE: No polyuria, nocturia,  HEMATOLOGY: No anemia, easy bruising or bleeding SKIN: No rash or lesion. MUSCULOSKELETAL: No joint pain or arthritis.   NEUROLOGIC: No tingling, numbness, weakness.  PSYCHIATRY: No anxiety or depression.   DRUG ALLERGIES:  No Known Allergies  VITALS:  Blood pressure (!) 107/59, pulse 63, temperature 98.2 F (36.8 C), temperature source Oral, resp. rate 18, height 5\' 7"  (1.702 m), weight 68.5 kg, last menstrual period 01/07/2018, SpO2 99 %.  PHYSICAL EXAMINATION:  GENERAL:  22 y.o.-year-old patient lying in the bed with no acute distress.  EYES: Pupils equal, round, reactive to light and accommodation. No scleral icterus. Extraocular muscles intact.  HEENT: Head atraumatic, normocephalic. Oropharynx and nasopharynx clear.  NECK:  Supple, no jugular venous distention. No thyroid enlargement, no tenderness.  LUNGS: Normal breath sounds bilaterally, no wheezing, rales,rhonchi or crepitation. No use of accessory muscles of respiration.  Breast -right breast and clean dressing tender, left breast is intact CARDIOVASCULAR: S1, S2 normal. No murmurs, rubs, or gallops.  ABDOMEN: Soft, nontender, nondistended. Bowel sounds present.   EXTREMITIES: No pedal edema, cyanosis, or clubbing.  NEUROLOGIC: Cranial nerves II through XII are intact. Muscle strength 5/5 in  all extremities. Sensation intact. Gait not checked.  PSYCHIATRIC: The patient is alert and oriented x 3.  SKIN: No obvious rash, lesion, or ulcer.    LABORATORY PANEL:   CBC Recent Labs  Lab 01/23/18 0541  WBC 9.3  HGB 12.5  HCT 36.6  PLT 327   ------------------------------------------------------------------------------------------------------------------  Chemistries  Recent Labs  Lab 01/21/18 1954  01/23/18 0541  NA 142   < > 139  K 3.1*   < > 3.5  CL 105   < > 109  CO2 27   < > 25  GLUCOSE 121*   < > 122*  BUN 10   < > 10  CREATININE 0.80   < > 0.64  CALCIUM 9.1   < > 8.4*  AST 23  --   --   ALT 18  --   --   ALKPHOS 56  --   --   BILITOT 0.5  --   --    < > = values in this interval not displayed.   ------------------------------------------------------------------------------------------------------------------  Cardiac Enzymes No results for input(s): TROPONINI in the last 168 hours. ------------------------------------------------------------------------------------------------------------------  RADIOLOGY:  Dg Chest 2 View  Result Date: 01/21/2018 CLINICAL DATA:  Recent right breast infection getting worse. EXAM: CHEST - 2 VIEW COMPARISON:  None. FINDINGS: Lungs are adequately inflated without consolidation or effusion. Cardiomediastinal silhouette is within normal. Bones and soft tissues are normal. IMPRESSION: No active cardiopulmonary disease. Electronically Signed   By: Elberta Fortis M.D.   On: 01/21/2018 21:25    EKG:   Orders placed or performed during the hospital encounter of 01/21/18  . EKG 12-Lead  . EKG 12-Lead  . EKG    ASSESSMENT AND PLAN:   *  Right breat abscess with periareolar necrotic tissue.  Status post IND  Pain management as needed  Continue IV antibiotics and follow-up with culture if sent by surgery Follow-up with surgery  * Pyuria without symptoms Not uti Patient is on broad-spectrum antibiotics for breast abscess    *Elevated lactic acid due to breast abscess.    Status post fluids continue close monitoring of the lactic acid level  *Hypokalemia.  Repleted potassium is at 3.8   All the records are reviewed and case discussed with Care Management/Social Workerr. Management plans discussed with the patient, family and they are in agreement.  CODE STATUS: full code. TOTAL TIME TAKING CARE OF THIS PATIENT: 34 minutes.   POSSIBLE D/C IN 2 DAYS, DEPENDING ON CLINICAL CONDITION.  Note: This dictation was prepared with Dragon dictation along with smaller phrase technology. Any transcriptional errors that result from this process are unintentional.   Altamese Dilling M.D on 01/23/2018 at 2:36 PM  Between 7am to 6pm - Pager - [phone] After 6pm go to [email or web address] - password [email or web address]  [facility]  Office  [phone]  CC: Primary care physician; Patient, No Pcp Per

## 2018-01-23 NOTE — Progress Notes (Signed)
Pharmacy Antibiotic Note  Tracy Stewart is a 22 y.o. female admitted on 01/21/2018 with breast abscess.  Pharmacy has been consulted for Vancomycin dosing.  Plan: Tracy Stewart is a 22 y.o. female admitted on 01/21/2018 with Right breast necrotizing soft tissue infection.  Pharmacy has been consulted for vancomycin dosing.  Plan: Vancomycin 1000mg  given in the OR. Will give next dose in 6 hours for stacked dosing Vancomycin 1000mg  IV every 8 hours.  Goal trough 15-20 mcg/mL. Trough prior to 5th dose  9/28:  VT @ 1800 = 11 mcg/mL Will increase dose to Vancomycin 1250 mg IV Q8H to start on 9/29 @ 0300. Will recheck VT before 3rd new dose on 9/29 @ 18:30.   Height: 5\' 7"  (170.2 cm) Weight: 151 lb (68.5 kg) IBW/kg (Calculated) : 61.6  Temp (24hrs), Avg:98.2 F (36.8 C), Min:98 F (36.7 C), Max:98.6 F (37 C)  Recent Labs  Lab 01/21/18 1954 01/21/18 2319 01/22/18 0532 01/23/18 0541 01/23/18 1819  WBC 13.9*  --  9.3 9.3  --   CREATININE 0.80  --  0.72 0.64  --   LATICACIDVEN 2.6* 1.1  --   --   --   VANCOTROUGH  --   --   --   --  11*    Estimated Creatinine Clearance: 106.4 mL/min (by C-G formula based on SCr of 0.64 mg/dL).    No Known Allergies  Antimicrobials this admission:   >>   >>   Dose adjustments this admission:  Microbiology results:  BCx:   UCx:    Sputum:    MRSA PCR:   Thank you for allowing pharmacy to be a part of this patient's care.  Joyce Heitman D 01/23/2018 7:43 PM

## 2018-01-24 LAB — VANCOMYCIN, TROUGH: VANCOMYCIN TR: 34 ug/mL — AB (ref 15–20)

## 2018-01-24 MED ORDER — IBUPROFEN 400 MG PO TABS: 400.0000 mg | ORAL_TABLET | Freq: Four times a day (QID) | ORAL | Status: DC | PRN

## 2018-01-24 MED ORDER — OXYCODONE-ACETAMINOPHEN 5-325 MG PO TABS: 1.0000 | ORAL_TABLET | Freq: Four times a day (QID) | ORAL | Status: DC | PRN

## 2018-01-24 NOTE — Progress Notes (Signed)
Pharmacy Antibiotic Note  Tracy Stewart is a 22 y.o. female admitted on 01/21/2018 with breast abscess.  Pharmacy has been consulted for Vancomycin dosing.  Plan: Tracy Stewart is a 22 y.o. female admitted on 01/21/2018 with Right breast necrotizing soft tissue infection.  Pharmacy has been consulted for vancomycin dosing.  Plan: Vancomycin 1000mg  given in the OR. Will give next dose in 6 hours for stacked dosing Vancomycin 1000mg  IV every 8 hours.  Goal trough 15-20 mcg/mL. Trough prior to 5th dose  9/28:  VT @ 1800 = 11 mcg/mL Will increase dose to Vancomycin 1250 mg IV Q8H to start on 9/29 @ 0300. Will recheck VT before 3rd new dose on 9/29 @ 18:30.   9/29:  VT @ 18:30 = 34 mcg/mL VT was draw and 18:30 but Vanc trough was draw 30 min after Vanc was hung , therefore not a valid trough. Will recheck VT before next dose on 9/30 @ 0230.   Height: 5\' 7"  (170.2 cm) Weight: 151 lb (68.5 kg) IBW/kg (Calculated) : 61.6  Temp (24hrs), Avg:98 F (36.7 C), Min:97.8 F (36.6 C), Max:98.1 F (36.7 C)  Recent Labs  Lab 01/21/18 1954 01/21/18 2319 01/22/18 0532 01/23/18 0541 01/23/18 1819 01/24/18 1836  WBC 13.9*  --  9.3 9.3  --   --   CREATININE 0.80  --  0.72 0.64  --   --   LATICACIDVEN 2.6* 1.1  --   --   --   --   VANCOTROUGH  --   --   --   --  11* 34*    Estimated Creatinine Clearance: 106.4 mL/min (by C-G formula based on SCr of 0.64 mg/dL).    No Known Allergies  Antimicrobials this admission:   >>   >>   Dose adjustments this admission:  Microbiology results:  BCx:   UCx:    Sputum:    MRSA PCR:   Thank you for allowing pharmacy to be a part of this patient's care.  Jackelyn Illingworth D 01/24/2018 7:44 PM

## 2018-01-24 NOTE — Progress Notes (Signed)
[facility] - [place] at [facility]   PATIENT NAME: Tracy Stewart    MR#:  [id number]  DATE OF BIRTH:  01-02-1996  SUBJECTIVE:  CHIEF COMPLAINT:  s/p I&D feeling much better pain is tolerable  REVIEW OF SYSTEMS:  CONSTITUTIONAL: No fever, fatigue or weakness.  EYES: No blurred or double vision.  EARS, NOSE, AND THROAT: No tinnitus or ear pain.  RESPIRATORY: No cough, shortness of breath, wheezing or hemoptysis.  CARDIOVASCULAR: No chest pain, orthopnea, edema.  GASTROINTESTINAL: No nausea, vomiting, diarrhea or abdominal pain.  GENITOURINARY: No dysuria, hematuria.  ENDOCRINE: No polyuria, nocturia,  HEMATOLOGY: No anemia, easy bruising or bleeding SKIN: No rash or lesion. MUSCULOSKELETAL: No joint pain or arthritis.   NEUROLOGIC: No tingling, numbness, weakness.  PSYCHIATRY: No anxiety or depression.   DRUG ALLERGIES:  No Known Allergies  VITALS:  Blood pressure 109/69, pulse 64, temperature 98.1 F (36.7 C), temperature source Oral, resp. rate 16, height 5\' 7"  (1.702 m), weight 68.5 kg, last menstrual period 01/07/2018, SpO2 99 %.  PHYSICAL EXAMINATION:  GENERAL:  22 y.o.-year-old patient lying in the bed with no acute distress.  EYES: Pupils equal, round, reactive to light and accommodation. No scleral icterus. Extraocular muscles intact.  HEENT: Head atraumatic, normocephalic. Oropharynx and nasopharynx clear.  NECK:  Supple, no jugular venous distention. No thyroid enlargement, no tenderness.  LUNGS: Normal breath sounds bilaterally, no wheezing, rales,rhonchi or crepitation. No use of accessory muscles of respiration.  Dressing present on the right breast. CARDIOVASCULAR: S1, S2 normal. No murmurs, rubs, or gallops.  ABDOMEN: Soft, nontender, nondistended. Bowel sounds present.   EXTREMITIES: No pedal edema, cyanosis, or clubbing.  NEUROLOGIC: Cranial nerves II through XII are intact. Muscle strength 5/5 in all extremities. Sensation intact.  Gait not checked.  PSYCHIATRIC: The patient is alert and oriented x 3.  SKIN: No obvious rash, lesion, or ulcer.    LABORATORY PANEL:   CBC Recent Labs  Lab 01/23/18 0541  WBC 9.3  HGB 12.5  HCT 36.6  PLT 327   ------------------------------------------------------------------------------------------------------------------  Chemistries  Recent Labs  Lab 01/21/18 1954  01/23/18 0541  NA 142   < > 139  K 3.1*   < > 3.5  CL 105   < > 109  CO2 27   < > 25  GLUCOSE 121*   < > 122*  BUN 10   < > 10  CREATININE 0.80   < > 0.64  CALCIUM 9.1   < > 8.4*  AST 23  --   --   ALT 18  --   --   ALKPHOS 56  --   --   BILITOT 0.5  --   --    < > = values in this interval not displayed.   ------------------------------------------------------------------------------------------------------------------  Cardiac Enzymes No results for input(s): TROPONINI in the last 168 hours. ------------------------------------------------------------------------------------------------------------------  RADIOLOGY:  No results found.  EKG:   Orders placed or performed during the hospital encounter of 01/21/18  . EKG 12-Lead  . EKG 12-Lead  . EKG    ASSESSMENT AND PLAN:   * Right breat abscess with periareolar necrotic tissue.  Status post IND  Pain management as needed  Continue IV antibiotics  Follow-up with surgery As per surgical team, still need to stay in and follow-up for dressing tomorrow.  * Pyuria without symptoms Not uti Patient is on broad-spectrum antibiotics for breast abscess   *Elevated lactic acid due to breast abscess.    Status post  fluids continue close monitoring of the lactic acid level  *Hypokalemia.  Repleted potassium is at 3.8   All the records are reviewed and case discussed with Care Management/Social Workerr. Management plans discussed with the patient, family and they are in agreement.  CODE STATUS: full code. TOTAL TIME TAKING CARE OF THIS  PATIENT: 34 minutes.   POSSIBLE D/C IN 2 DAYS, DEPENDING ON CLINICAL CONDITION.  Note: This dictation was prepared with Dragon dictation along with smaller phrase technology. Any transcriptional errors that result from this process are unintentional.   Altamese Dilling M.D on 01/24/2018 at 2:21 PM  Between 7am to 6pm - Pager - [phone] After 6pm go to [email or web address] - password [email or web address]  [facility]  Office  [phone]  CC: Primary care physician; Patient, No Pcp Per

## 2018-01-24 NOTE — Progress Notes (Signed)
Subjective:  CC:  Tracy Stewart is a 22 y.o. female  Hospital stay day 3, 2 Days Post-Op right breast abscess debridement  HPI: No issues overnight.  Still Feeling better  ROS:  A 5 point review of systems was performed and pertinent positives and negatives noted in HPI.   Objective:      Temp:  [98.1 F (36.7 C)-98.6 F (37 C)] 98.1 F (36.7 C) (09/29 0807) Pulse Rate:  [57-64] 64 (09/29 0807) Resp:  [16-18] 16 (09/29 0807) BP: (99-122)/(62-69) 109/69 (09/29 0807) SpO2:  [98 %-100 %] 99 % (09/29 0807)     Height: 5\' 7"  (170.2 cm) Weight: 68.5 kg BMI (Calculated): 23.64   Intake/Output this shift:  Total I/O In: 240 [P.O.:240] Out: -        Constitutional :  alert, cooperative, appears stated age and no distress  Respiratory:  clear to auscultation bilaterally  Cardiovascular:  regular rate and rhythm  Gastrointestinal: soft, non-tender; bowel sounds normal; no masses,  no organomegaly.   Skin: Cool and moist. Right breast dressing intact, with not streakthrough, minimally tender to touch.  Removal of dressing with chaperone present noted a penrose in place, with no obvious discharge.  Exudative discharge noted on dressing, but wound base with healthy granulation tissue.  Discomfort noted during entire dressing change even with morphine.  Psychiatric: Normal affect, non-agitated, not confused       LABS:  CMP Latest Ref Rng & Units 01/23/2018 01/22/2018 01/21/2018  Glucose 70 - 99 mg/dL 161(W) 95 960(A)  BUN 6 - 20 mg/dL 10 9 10   Creatinine 0.44 - 1.00 mg/dL 5.40 9.81 1.91  Sodium 135 - 145 mmol/L 139 141 142  Potassium 3.5 - 5.1 mmol/L 3.5 3.8 3.1(L)  Chloride 98 - 111 mmol/L 109 109 105  CO2 22 - 32 mmol/L 25 25 27   Calcium 8.9 - 10.3 mg/dL 4.7(W) 2.9(F) 9.1  Total Protein 6.5 - 8.1 g/dL - - 7.9  Total Bilirubin 0.3 - 1.2 mg/dL - - 0.5  Alkaline Phos 38 - 126 U/L - - 56  AST 15 - 41 U/L - - 23  ALT 0 - 44 U/L - - 18   CBC Latest Ref Rng & Units 01/23/2018  01/22/2018 01/21/2018  WBC 3.6 - 11.0 K/uL 9.3 9.3 13.9(H)  Hemoglobin 12.0 - 16.0 g/dL 62.1 30.8 65.7  Hematocrit 35.0 - 47.0 % 36.6 36.4 38.4  Platelets 150 - 440 K/uL 327 361 407    RADS: n/a Assessment:   S/p debridement of right breast tissue.  Doing well.  Dressing change completed with wet to dry for now.  followup wound check again tomorrow.

## 2018-01-25 DIAGNOSIS — M7989 Other specified soft tissue disorders: Secondary | ICD-10-CM

## 2018-01-25 DIAGNOSIS — M726 Necrotizing fasciitis: Secondary | ICD-10-CM

## 2018-01-25 LAB — BASIC METABOLIC PANEL
Anion gap: 5 (ref 5–15)
BUN: 9 mg/dL (ref 6–20)
CALCIUM: 8.8 mg/dL — AB (ref 8.9–10.3)
CO2: 29 mmol/L (ref 22–32)
Chloride: 104 mmol/L (ref 98–111)
Creatinine, Ser: 0.75 mg/dL (ref 0.44–1.00)
GFR calc Af Amer: >60 mL/min (ref >60)
GLUCOSE: 109 mg/dL — AB (ref 70–99)
POTASSIUM: 4 mmol/L (ref 3.5–5.1)
Sodium: 138 mmol/L (ref 135–145)

## 2018-01-25 LAB — VANCOMYCIN, TROUGH: Vancomycin Tr: 15 ug/mL (ref 15–20)

## 2018-01-25 MED ORDER — AMOXICILLIN-POT CLAVULANATE 875-125 MG PO TABS: 1.0000 | ORAL_TABLET | Freq: Two times a day (BID) | ORAL | 0 refills | Status: AC

## 2018-01-25 MED ORDER — IBUPROFEN 400 MG PO TABS: 400.0000 mg | ORAL_TABLET | Freq: Four times a day (QID) | ORAL | 0 refills | Status: AC | PRN

## 2018-01-25 NOTE — Progress Notes (Signed)
Patient is being discharged home. Dressing changed by MD this am. Dressing supplies sent with patient. IVs removed with caths intact. Reviewed meds, scripts, and last dose given. Allowed time for questions.

## 2018-01-25 NOTE — Discharge Summary (Signed)
[facility] - [place] at [facility]   PATIENT NAME: Tracy Stewart    MR#:  [id number]  DATE OF BIRTH:  November 29, 1995  DATE OF ADMISSION:  01/21/2018 ADMITTING PHYSICIAN: Milagros Loll, MD  DATE OF DISCHARGE: 01/25/2018   PRIMARY CARE PHYSICIAN: Patient, No Pcp Per    ADMISSION DIAGNOSIS:  Necrotizing soft tissue infection [M79.89]  DISCHARGE DIAGNOSIS:  Active Problems:   Breast abscess  SECONDARY DIAGNOSIS:  History reviewed. No pertinent past medical history.  HOSPITAL COURSE:    *Right breat abscess with periareolar necrotic tissue.  Status post IND  Pain management as needed  Continue IV antibiotics  Follow-up with surgery As per surgical team, her incision and wound is healing well and can be discharged with oral antibiotic and dressing at home. We will give oral Augmentin on discharge.  She did not require much pain medications here so advised to take ibuprofen if needed at home.  * Pyuria without symptoms Not uti Patient is onbroad-spectrum antibiotics for breast abscess  *Elevated lactic acid due to breast abscess.   Status post fluids continue close monitoring of the lactic acid level  *Hypokalemia.  Repleted potassium is at 3.8   DISCHARGE CONDITIONS:   Stable.  CONSULTS OBTAINED:  Treatment Team:  Leafy Ro, MD Ancil Linsey, MD  DRUG ALLERGIES:  No Known Allergies  DISCHARGE MEDICATIONS:   Allergies as of 01/25/2018   No Known Allergies     Medication List    TAKE these medications   amoxicillin-clavulanate 875-125 MG tablet Commonly known as:  AUGMENTIN Take 1 tablet by mouth 2 (two) times daily for 5 days.   ibuprofen 400 MG tablet Commonly known as:  ADVIL,MOTRIN Take 1 tablet (400 mg total) by mouth every 6 (six) hours as needed for headache, mild pain or moderate pain.        DISCHARGE INSTRUCTIONS:    Follow with surgery in 1 week.  If you experience worsening of your admission  symptoms, develop shortness of breath, life threatening emergency, suicidal or homicidal thoughts you must seek medical attention immediately by calling 911 or calling your MD immediately  if symptoms less severe.  You Must read complete instructions/literature along with all the possible adverse reactions/side effects for all the Medicines you take and that have been prescribed to you. Take any new Medicines after you have completely understood and accept all the possible adverse reactions/side effects.   Please note  You were cared for by a hospitalist during your hospital stay. If you have any questions about your discharge medications or the care you received while you were in the hospital after you are discharged, you can call the unit and asked to speak with the hospitalist on call if the hospitalist that took care of you is not available. Once you are discharged, your primary care physician will handle any further medical issues. Please note that NO REFILLS for any discharge medications will be authorized once you are discharged, as it is imperative that you return to your primary care physician (or establish a relationship with a primary care physician if you do not have one) for your aftercare needs so that they can reassess your need for medications and monitor your lab values.   Today   CHIEF COMPLAINT:   Chief Complaint  Patient presents with  . Breast Pain    HISTORY OF PRESENT ILLNESS:  Tracy Stewart  is a 22 y.o. female with a known history of no significant past medical  history presented to the emergency room sent in from urgent care after being found to have breast abscess.  Patient has had redness and swelling along with fever for 3 days.  Today it spontaneously started draining.  She did have nipple rings which were removed in the emergency room.  She has had these for 5 years.  Is not breast-feeding.  Does not use any IV drugs.  No recent breast  procedures. Nondiabetic.  VITAL SIGNS:  Blood pressure 101/61, pulse 68, temperature 97.9 F (36.6 C), temperature source Oral, resp. rate 18, height 5\' 7"  (1.702 m), weight 68.5 kg, last menstrual period 01/07/2018, SpO2 99 %.  I/O:  No intake or output data in the 24 hours ending 01/25/18 1254  PHYSICAL EXAMINATION:   GENERAL:  22 y.o.-year-old patient lying in the bed with no acute distress.  EYES: Pupils equal, round, reactive to light and accommodation. No scleral icterus. Extraocular muscles intact.  HEENT: Head atraumatic, normocephalic. Oropharynx and nasopharynx clear.  NECK:  Supple, no jugular venous distention. No thyroid enlargement, no tenderness.  LUNGS: Normal breath sounds bilaterally, no wheezing, rales,rhonchi or crepitation. No use of accessory muscles of respiration.  Dressing present on the right breast. CARDIOVASCULAR: S1, S2 normal. No murmurs, rubs, or gallops.  ABDOMEN: Soft, nontender, nondistended. Bowel sounds present.   EXTREMITIES: No pedal edema, cyanosis, or clubbing.  NEUROLOGIC: Cranial nerves II through XII are intact. Muscle strength 5/5 in all extremities. Sensation intact. Gait not checked.  PSYCHIATRIC: The patient is alert and oriented x 3.  SKIN: No obvious rash, lesion, or ulcer.   DATA REVIEW:   CBC Recent Labs  Lab 01/23/18 0541  WBC 9.3  HGB 12.5  HCT 36.6  PLT 327    Chemistries  Recent Labs  Lab 01/21/18 1954  01/25/18 0221  NA 142   < > 138  K 3.1*   < > 4.0  CL 105   < > 104  CO2 27   < > 29  GLUCOSE 121*   < > 109*  BUN 10   < > 9  CREATININE 0.80   < > 0.75  CALCIUM 9.1   < > 8.8*  AST 23  --   --   ALT 18  --   --   ALKPHOS 56  --   --   BILITOT 0.5  --   --    < > = values in this interval not displayed.    Cardiac Enzymes No results for input(s): TROPONINI in the last 168 hours.  Microbiology Results  Results for orders placed or performed during the hospital encounter of 01/21/18  Surgical pcr screen      Status: None   Collection Time: 01/21/18 11:31 PM  Result Value Ref Range Status   MRSA, PCR NEGATIVE NEGATIVE Final   Staphylococcus aureus NEGATIVE NEGATIVE Final    Comment: (NOTE) The Xpert SA Assay (FDA approved for NASAL specimens in patients 29 years of age and older), is one component of a comprehensive surveillance program. It is not intended to diagnose infection nor to guide or monitor treatment. Performed at [facility], [address] [place], [place] [address]     RADIOLOGY:  No results found.  EKG:   Orders placed or performed during the hospital encounter of 01/21/18  . EKG 12-Lead  . EKG 12-Lead  . EKG      Management plans discussed with the patient, family and they are in agreement.  CODE STATUS:  Code Status Orders  (From admission, onward)         Start     Ordered   01/21/18 2050  Full code  Continuous     01/21/18 2053        Code Status History    This patient has a current code status but no historical code status.      TOTAL TIME TAKING CARE OF THIS PATIENT: 35 minutes.    Altamese Dilling M.D on 01/25/2018 at 12:54 PM  Between 7am to 6pm - Pager - [phone]  After 6pm go to [email or web address] - password [email or web address]  [facility]  Office  [phone]  CC: Primary care physician; Patient, No Pcp Per   Note: This dictation was prepared with Dragon dictation along with smaller phrase technology. Any transcriptional errors that result from this process are unintentional.

## 2018-01-25 NOTE — Progress Notes (Signed)
Pharmacy Antibiotic Note  Tracy Stewart is a 22 y.o. female admitted on 01/21/2018 with breast abscess.  Pharmacy has been consulted for Vancomycin dosing.  Plan: Tracy Stewart is a 22 y.o. female admitted on 01/21/2018 with Right breast necrotizing soft tissue infection.  Pharmacy has been consulted for vancomycin dosing.  Plan: 09/30 @ 0220 VT 15 mcg/mL drawn appropriately. Will continue current regimen and recheck next trough 10/02 @ 0200. Renal function improving will continue to monitor.  Height: 5\' 7"  (170.2 cm) Weight: 151 lb (68.5 kg) IBW/kg (Calculated) : 61.6  Temp (24hrs), Avg:98.2 F (36.8 C), Min:97.8 F (36.6 C), Max:98.8 F (37.1 C)  Recent Labs  Lab 01/21/18 1954 01/21/18 2319 01/22/18 0532 01/23/18 0541  01/24/18 1836 01/25/18 0220  WBC 13.9*  --  9.3 9.3  --   --   --   CREATININE 0.80  --  0.72 0.64  --   --   --   LATICACIDVEN 2.6* 1.1  --   --   --   --   --   VANCOTROUGH  --   --   --   --    < > 34* 15   < > = values in this interval not displayed.    Estimated Creatinine Clearance: 106.4 mL/min (by C-G formula based on SCr of 0.64 mg/dL).    No Known Allergies  Antimicrobials this admission:   >>   >>   Dose adjustments this admission:  Microbiology results:  BCx:   UCx:    Sputum:    MRSA PCR:   Thank you for allowing pharmacy to be a part of this patient's care.  Thomasene Ripple, PharmD, BCPS Clinical Pharmacist 01/25/2018

## 2018-01-25 NOTE — Discharge Instructions (Signed)
In addition to included general post-operative instructions for Surgical Debridement of Right Breast Necrotizing Soft Tissue Infection,  Diet: Resume home heart healthy diet.   Activity: Light activity and walking are encouraged. Do not drive or drink alcohol if taking narcotic pain medications.  Wound care: Change moist-to-dry dressing at least once every day (water-moistened gauze on the wound, covered by several dry gauze). Do not wet dressing before removing. You may shower/get incision wet with soapy water and pat dry (do not rub incisions) after removing dressing. Replace dressing after shower. No baths or submerging incision underwater until follow-up.   Medications: Resume all home medications. Complete prescribed course of antibiotics even if feeling better/well. For mild to moderate pain: acetaminophen (Tylenol) or ibuprofen/naproxen (if no kidney disease). Combining Tylenol with alcohol can substantially increase your risk of causing liver disease. Narcotic pain medications, if prescribed, can be used for severe pain, though may cause nausea, constipation, and drowsiness. Do not combine Tylenol and Percocet (or similar) within a 6 hour period as Percocet (and similar) contain(s) Tylenol. If you do not need the narcotic pain medication, you do not need to fill the prescription.  Call office [phone]) at any time if any questions, worsening pain, fevers/chills, bleeding, drainage from incision site, or other concerns.

## 2018-01-25 NOTE — Progress Notes (Signed)
[facility] Progress Note  3 Days Post-Op  Subjective: She is doing well this morning. Her pain has improved. No fevers. Tolerating a diet. Mobilizing well.   Objective: Vital signs in last 24 hours: Temp:  [97.8 F (36.6 C)-98.8 F (37.1 C)] 97.9 F (36.6 C) (09/30 0800) Pulse Rate:  [57-68] 68 (09/30 0800) Resp:  [18-20] 18 (09/30 0800) BP: (101-116)/(61-77) 101/61 (09/30 0800) SpO2:  [97 %-99 %] 99 % (09/30 0800) Last BM Date: 01/24/18  Intake/Output from previous day: 09/29 0701 - 09/30 0700 In: 240 [P.O.:240] Out: -  Intake/Output this shift: No intake/output data recorded.  PE: Gen:  Alert, NAD, pleasant Chest: Peri-areolar wound to the 12-3 o'clock position, tissue is well perfused, no purulent drainage, penrose in place, dressing changed Pulm:  Normal effort Skin: warm and dry, no rashes  Psych: A&Ox3   Lab Results:  Recent Labs    01/23/18 0541  WBC 9.3  HGB 12.5  HCT 36.6  PLT 327   BMET Recent Labs    01/23/18 0541 01/25/18 0221  NA 139 138  K 3.5 4.0  CL 109 104  CO2 25 29  GLUCOSE 122* 109*  BUN 10 9  CREATININE 0.64 0.75  CALCIUM 8.4* 8.8*   PT/INR No results for input(s): LABPROT, INR in the last 72 hours. CMP     Component Value Date/Time   NA 138 01/25/2018 0221   K 4.0 01/25/2018 0221   CL 104 01/25/2018 0221   CO2 29 01/25/2018 0221   GLUCOSE 109 (H) 01/25/2018 0221   BUN 9 01/25/2018 0221   CREATININE 0.75 01/25/2018 0221   CALCIUM 8.8 (L) 01/25/2018 0221   PROT 7.9 01/21/2018 1954   ALBUMIN 3.9 01/21/2018 1954   AST 23 01/21/2018 1954   ALT 18 01/21/2018 1954   ALKPHOS 56 01/21/2018 1954   BILITOT 0.5 01/21/2018 1954   GFRNONAA >60 01/25/2018 0221   GFRAA >60 01/25/2018 0221   Lipase  No results found for: LIPASE     Studies/Results: No results found.  Anti-infectives: Anti-infectives (From admission, onward)   Start     Dose/Rate Route Frequency Ordered Stop   01/24/18 0300  vancomycin  (VANCOCIN) 1,250 mg in sodium chloride 0.9 % 250 mL IVPB     1,250 mg 166.7 mL/hr over 90 Minutes Intravenous Every 8 hours 01/23/18 1942     01/22/18 1800  vancomycin (VANCOCIN) IVPB 1000 mg/200 mL premix  Status:  Discontinued     1,000 mg 200 mL/hr over 60 Minutes Intravenous Every 8 hours 01/22/18 1719 01/23/18 1941   01/22/18 1530  vancomycin (VANCOCIN) IVPB 1000 mg/200 mL premix  Status:  Discontinued     1,000 mg 200 mL/hr over 60 Minutes Intravenous Every 8 hours 01/22/18 1216 01/22/18 1719   01/22/18 0927  vancomycin (VANCOCIN) 1-5 GM/200ML-% IVPB    Note to Pharmacy:  Letta Pate   : cabinet override      01/22/18 0927 01/22/18 1400   01/21/18 2000  vancomycin (VANCOCIN) IVPB 1000 mg/200 mL premix     1,000 mg 200 mL/hr over 60 Minutes Intravenous  Once 01/21/18 1956 01/21/18 2207   01/21/18 2000  ceFEPIme (MAXIPIME) 1 g in sodium chloride 0.9 % 100 mL IVPB     1 g 200 mL/hr over 30 Minutes Intravenous  Once 01/21/18 1956 01/21/18 2251       Assessment/Plan  Right Breast Abscess - POD3, doing well, pain improved, no fevers - Dressing changed today, wound without purulence,  well-healing pink well perfused tissue.  - Recommend daily dressing changes - Home with ABx - Follow up with general surgery in 1 week.    LOS: 4 days    Lynden Oxford , PA-C [facility] 01/25/2018, 11:56 AM [phone] M-F: 7am - 4pm

## 2018-02-02 ENCOUNTER — Encounter: Payer: Self-pay | Admitting: Surgery

## 2018-02-04 ENCOUNTER — Encounter: Payer: Self-pay | Admitting: Surgery

## 2018-02-04 ENCOUNTER — Ambulatory Visit (INDEPENDENT_AMBULATORY_CARE_PROVIDER_SITE_OTHER): Payer: Self-pay | Admitting: Surgery

## 2018-02-04 VITALS — BP 130/77 | HR 74 | Temp 98.1°F | Ht 67.0 in | Wt 152.4 lb

## 2018-02-04 DIAGNOSIS — M7989 Other specified soft tissue disorders: Secondary | ICD-10-CM

## 2018-02-04 DIAGNOSIS — Z4889 Encounter for other specified surgical aftercare: Secondary | ICD-10-CM

## 2018-02-04 NOTE — Progress Notes (Signed)
Surgical Clinic Progress/Follow-up Note   HPI:  22 y.o. Female presents to clinic for post-op follow-up 13 Days s/p sharp excisional debridement of Right medial peri-areolar necrotizing soft tissue infection without abscess Earlene Plater, 01/22/2018). Patient reports complete resolution of pre-operative pain and has been feeling good without significant pain and only mild itching sensation, no surrounding redness or drainage. She has been changing her dry gauze dressing once daily and has completed her prescribed course of antibiotics, denies fever/chills, diarrhea, N/V, CP, or SOB.  Review of Systems:  Constitutional: denies fever/chills  Respiratory: denies shortness of breath, wheezing  Cardiovascular: denies chest pain, palpitations  Breast: pain and drainage as per interval history Skin: Denies any other rashes or skin discolorations except post-surgical wounds as per interval history  Vital Signs:  BP 130/77   Pulse 74   Temp 98.1 F (36.7 C) (Temporal)   Ht 5\' 7"  (1.702 m)   Wt 152 lb 6.4 oz (69.1 kg)   LMP 01/07/2018 (Exact Date)   BMI 23.87 kg/m    Physical Exam:  Constitutional:  -- Normal body habitus  -- Awake, alert, and oriented x3  Pulmonary:  -- No crackles -- Equal breath sounds bilaterally -- Breathing non-labored at rest Cardiovascular:  -- S1, S2 present  -- No pericardial rubs  Breast: -- Right medial breast peri-areolar wound with healthy pink well-perfused granulation tissue with no residual undermining, surrounding erythema, or purulent drainage, no nipple discharge -- Penrose drain remains well-secured without surrounding erythema or drainage  Musculoskeletal / Integumentary:  -- Wounds or skin discoloration: None appreciated except post-surgical incisions as described above (Breasts) -- Extremities: B/L UE and LE FROM, hands and feet warm, no edema   Imaging: No new pertinent imaging available for review  Assessment:  22 y.o. yo Female with a problem  list including...  Patient Active Problem List   Diagnosis Date Noted  . Necrotizing soft tissue infection   . Necrotizing fasciitis (HCC)   . Breast abscess 01/21/2018    presents to clinic for post-op follow-up evaluation, doing very well 13 Days s/p sharp excisional debridement of Right medial peri-areolar necrotizing soft tissue infection without abscess Earlene Plater, 01/22/2018).  Plan:              - Penrose drain removed uneventfully  - change gauze dressing at least once daily + prn             - okay to submerge wound under water (baths, swimming) prn             - gradually resume all activities without restrictions over next 2 weeks             - apply sunblock particularly to incisions with sun exposure to reduce pigmentation of scars  - routine follow-up appointment offered, patient expresses preference to follow up as needed             - return to clinic as needed, instructed to call office if any questions or concerns  All of the above recommendations were discussed with the patient, and all of patient's questions were answered to her expressed satisfaction.  -- Scherrie Gerlach Earlene Plater, MD, RPVI [place] General Surgery - Partnering for exceptional care. Office: [phone]

## 2018-02-04 NOTE — Patient Instructions (Signed)
You may remove the dressing and shower as usual. Allow soap and water to run over the wound. Pat dry and place dressing over the wound and secure with tape.   Please call with any questions or concerns.

## 2019-11-26 IMAGING — CR DG CHEST 2V
1 series · 2 of 2 positions shown · non-contrast
Comparison: None.

CLINICAL DATA: Recent right breast infection getting worse.

EXAM:
CHEST - 2 VIEW

[Series 2: w chest lat · 0.14mm/px · 2 of 2 slices shown]
[im 1/2]
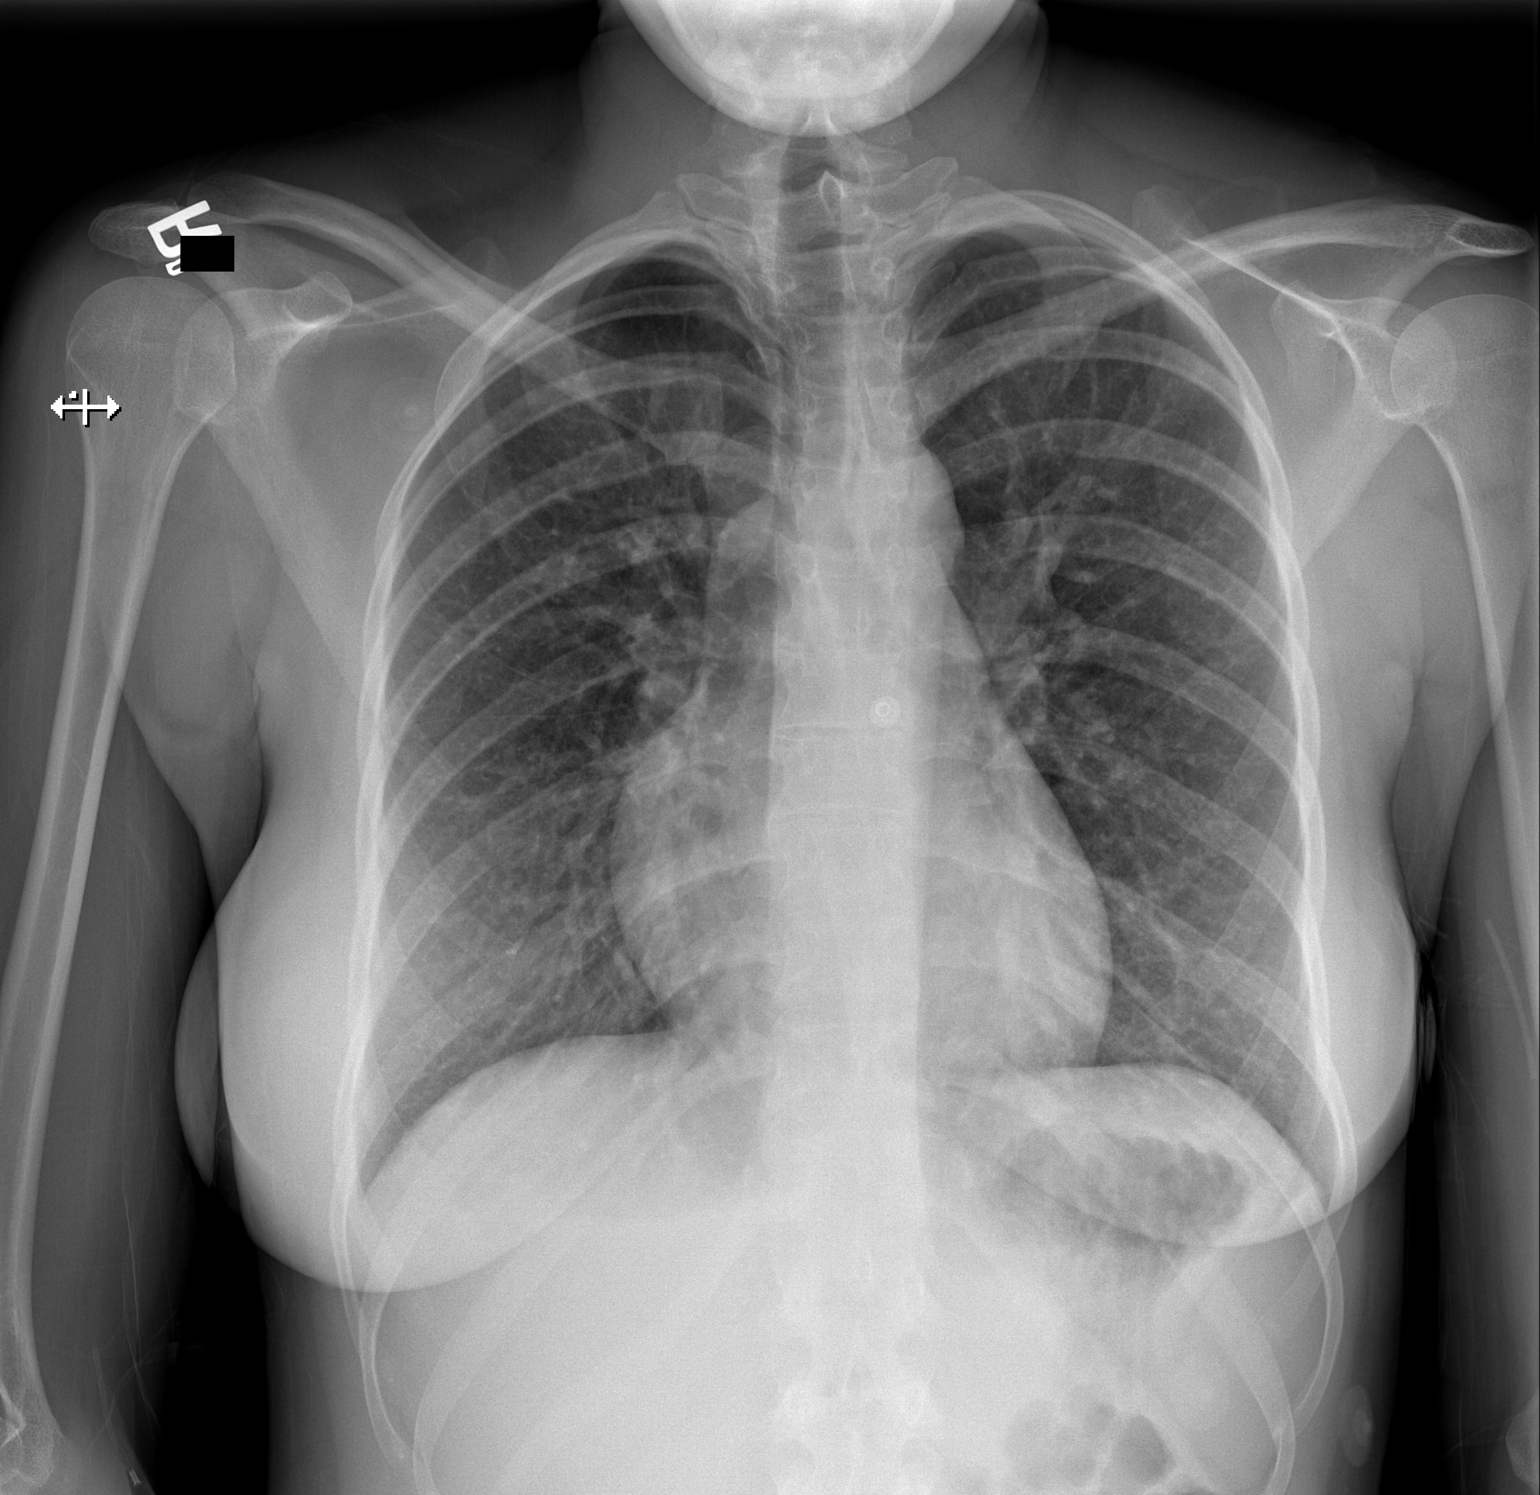
[im 2/2]
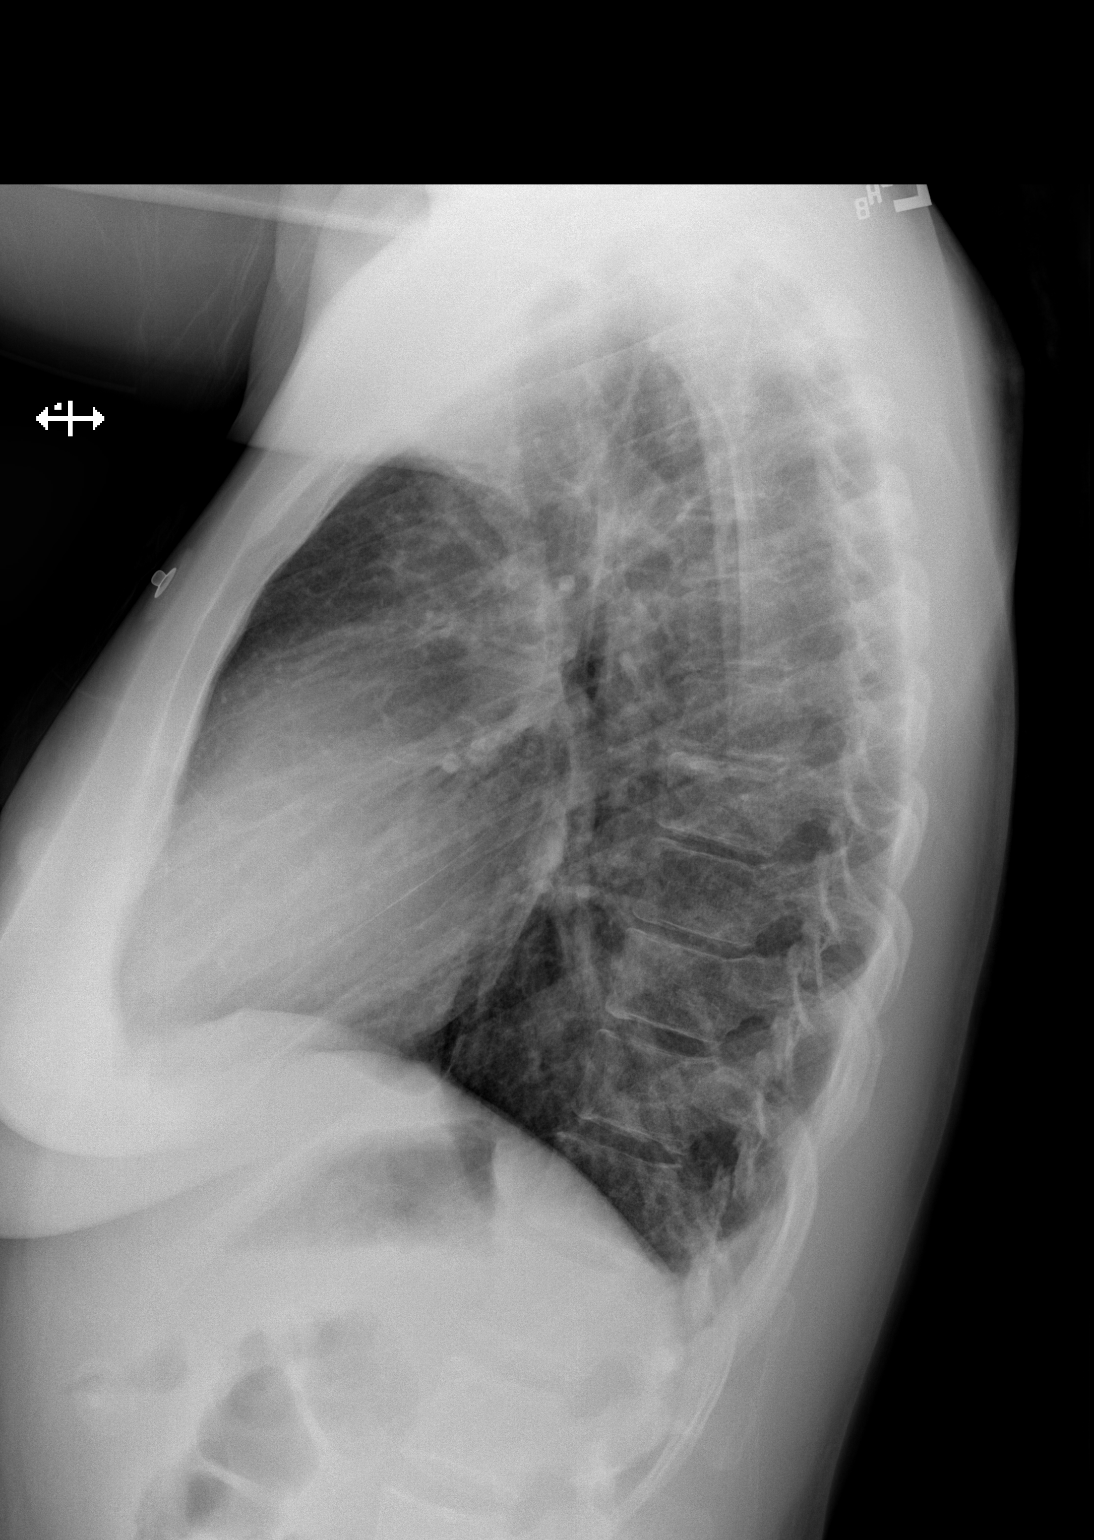

[2 of 2 positions shown; findings below may reference images not displayed]

FINDINGS: Lungs are adequately inflated without consolidation or effusion.
Cardiomediastinal silhouette is within normal. Bones and soft
tissues are normal.
IMPRESSION: No active cardiopulmonary disease.

## 2023-10-28 ENCOUNTER — Ambulatory Visit
